# Patient Record
Sex: Male | Born: 1935 | Race: Black or African American | Hispanic: No | Smoking: Former smoker
Health system: Southern US, Community
[De-identification: ages and names within clinical notes are randomized; demographics above are authoritative.]

## PROBLEM LIST (undated history)

## (undated) DIAGNOSIS — M1990 Unspecified osteoarthritis, unspecified site: Secondary | ICD-10-CM

## (undated) DIAGNOSIS — D638 Anemia in other chronic diseases classified elsewhere: Secondary | ICD-10-CM

## (undated) DIAGNOSIS — N189 Chronic kidney disease, unspecified: Secondary | ICD-10-CM

## (undated) DIAGNOSIS — J302 Other seasonal allergic rhinitis: Secondary | ICD-10-CM

## (undated) DIAGNOSIS — E785 Hyperlipidemia, unspecified: Secondary | ICD-10-CM

## (undated) DIAGNOSIS — D126 Benign neoplasm of colon, unspecified: Secondary | ICD-10-CM

## (undated) DIAGNOSIS — I10 Essential (primary) hypertension: Secondary | ICD-10-CM

## (undated) DIAGNOSIS — C61 Malignant neoplasm of prostate: Secondary | ICD-10-CM

## (undated) DIAGNOSIS — K5790 Diverticulosis of intestine, part unspecified, without perforation or abscess without bleeding: Secondary | ICD-10-CM

## (undated) DIAGNOSIS — C801 Malignant (primary) neoplasm, unspecified: Secondary | ICD-10-CM

## (undated) HISTORY — DX: Chronic kidney disease, unspecified: N18.9

## (undated) HISTORY — DX: Benign neoplasm of colon, unspecified: D12.6

## (undated) HISTORY — PX: INSERTION PROSTATE RADIATION SEED: SUR718

## (undated) HISTORY — DX: Unspecified osteoarthritis, unspecified site: M19.90

## (undated) HISTORY — DX: Malignant neoplasm of prostate: C61

## (undated) HISTORY — DX: Anemia in other chronic diseases classified elsewhere: D63.8

## (undated) HISTORY — PX: APPENDECTOMY: SHX54

## (undated) HISTORY — PX: EYE SURGERY: SHX253

## (undated) HISTORY — DX: Diverticulosis of intestine, part unspecified, without perforation or abscess without bleeding: K57.90

## (undated) HISTORY — DX: Hyperlipidemia, unspecified: E78.5

## (undated) HISTORY — PX: HERNIA REPAIR: SHX51

---

## 1998-09-25 ENCOUNTER — Ambulatory Visit (HOSPITAL_COMMUNITY): Admission: RE | Admit: 1998-09-25 | Discharge: 1998-09-25 | Payer: Self-pay | Admitting: Ophthalmology

## 1998-09-25 ENCOUNTER — Encounter: Payer: Self-pay | Admitting: Ophthalmology

## 1999-10-19 ENCOUNTER — Other Ambulatory Visit: Admission: RE | Admit: 1999-10-19 | Discharge: 1999-10-19 | Payer: Self-pay | Admitting: Urology

## 1999-11-12 DIAGNOSIS — C801 Malignant (primary) neoplasm, unspecified: Secondary | ICD-10-CM

## 1999-11-12 HISTORY — DX: Malignant (primary) neoplasm, unspecified: C80.1

## 1999-12-03 ENCOUNTER — Encounter: Admission: RE | Admit: 1999-12-03 | Discharge: 1999-12-03 | Payer: Self-pay | Admitting: Family Medicine

## 1999-12-03 ENCOUNTER — Encounter: Payer: Self-pay | Admitting: Family Medicine

## 1999-12-07 ENCOUNTER — Encounter: Payer: Self-pay | Admitting: Family Medicine

## 1999-12-07 ENCOUNTER — Encounter: Admission: RE | Admit: 1999-12-07 | Discharge: 1999-12-07 | Payer: Self-pay | Admitting: Family Medicine

## 1999-12-12 ENCOUNTER — Encounter: Admission: RE | Admit: 1999-12-12 | Discharge: 1999-12-12 | Payer: Self-pay | Admitting: Family Medicine

## 1999-12-12 ENCOUNTER — Encounter: Payer: Self-pay | Admitting: Family Medicine

## 2000-01-23 ENCOUNTER — Encounter: Admission: RE | Admit: 2000-01-23 | Discharge: 2000-04-22 | Payer: Self-pay | Admitting: Radiation Oncology

## 2000-04-01 ENCOUNTER — Encounter: Payer: Self-pay | Admitting: Urology

## 2000-04-01 ENCOUNTER — Ambulatory Visit (HOSPITAL_BASED_OUTPATIENT_CLINIC_OR_DEPARTMENT_OTHER): Admission: RE | Admit: 2000-04-01 | Discharge: 2000-04-02 | Payer: Self-pay | Admitting: Urology

## 2000-05-15 ENCOUNTER — Encounter: Admission: RE | Admit: 2000-05-15 | Discharge: 2000-08-13 | Payer: Self-pay | Admitting: Radiation Oncology

## 2000-11-25 ENCOUNTER — Encounter: Admission: RE | Admit: 2000-11-25 | Discharge: 2001-02-23 | Payer: Self-pay | Admitting: Radiation Oncology

## 2003-03-21 ENCOUNTER — Encounter (INDEPENDENT_AMBULATORY_CARE_PROVIDER_SITE_OTHER): Payer: Self-pay | Admitting: Specialist

## 2003-03-21 ENCOUNTER — Ambulatory Visit (HOSPITAL_COMMUNITY): Admission: RE | Admit: 2003-03-21 | Discharge: 2003-03-21 | Payer: Self-pay | Admitting: Gastroenterology

## 2004-07-02 ENCOUNTER — Encounter: Admission: RE | Admit: 2004-07-02 | Discharge: 2004-07-02 | Payer: Self-pay | Admitting: Family Medicine

## 2007-09-14 ENCOUNTER — Emergency Department (HOSPITAL_COMMUNITY): Admission: EM | Admit: 2007-09-14 | Discharge: 2007-09-14 | Payer: Self-pay | Admitting: *Deleted

## 2011-03-29 NOTE — Op Note (Signed)
NAME:  Alan Ewing, Alan Ewing                        ACCOUNT NO.:  [id number]   MEDICAL RECORD NO.:  [id number]                   PATIENT TYPE:  AMB   LOCATION:  ENDO                                 FACILITY:  [facility]   PHYSICIAN:  Graylin Shiver, M.D.                DATE OF BIRTH:  [birth year]   DATE OF PROCEDURE:  03/21/2003  DATE OF DISCHARGE:                                 OPERATIVE REPORT   PROCEDURE:  Colonoscopy with polypectomy.   INDICATIONS FOR PROCEDURE:  Rectal bleeding.   PREMEDICATION:  Fentanyl total dose 100 mcg IV, Versed total dose 8 mg IV.  This procedure was done directly after an EGD.   INFORMED CONSENT:  Informed consent was obtained.   DESCRIPTION OF PROCEDURE:  With the patient in the left lateral decubitus  position, a rectal exam was performed and no masses were felt. The Olympus  colonoscope was then inserted into the rectum and advanced around the colon  to the cecum. Cecal landmarks were identified. The cecum looked normal. In  the proximal ascending colon, there was a 5 mm sessile polyp which was  snared and removed by snare cautery technique. The polyp was retrieved, the  cautery site looked good. The rest of the ascending colon looked normal.  The transverse colon looked normal.  The descending colon and sigmoid  revealed moderate diverticulosis. In the distal sigmoid colon, there was a 5  mm sessile polyp which was snared and removed by snare cautery technique,  the polyp was retrieved, cautery site looked good. 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  This was characterized by reddish mucosa and dilated capillaries. A biopsy  was obtained from this area for histological confirmation. He tolerated the  procedure well without complications.   IMPRESSION:  1. 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 I believe this is the source of his rectal  bleeding.  2. Diverticulosis of the left colon.  3. Colon polyps as described above.    PLAN:  The pathology will be checked. I would recommend that the patient be  on a high fiber diet long-term and a therapeutic trial of a bulk agent such  as Metamucil or Citrucel. Should bleeding be an ongoing problem, a  therapeutic trial of a cortisone suppository or enema may be of benefit for  the radiation induced proctitis. A Panasal suppository may also be of  benefit.                                               Graylin Shiver, M.D.    [name]  D:  03/21/2003  T:  03/22/2003  Job:  [id number]   cc:  Lindaann Slough, M.D.  [address]  [place]  [place] [address]  Fax: [phone]

## 2011-03-29 NOTE — Op Note (Signed)
   NAME:  Alan Ewing, Alan Ewing                        ACCOUNT NO.:  [id number]   MEDICAL RECORD NO.:  [id number]                   PATIENT TYPE:  AMB   LOCATION:  ENDO                                 FACILITY:  [facility]   PHYSICIAN:  Graylin Shiver, M.D.                DATE OF BIRTH:  [birth year]   DATE OF PROCEDURE:  03/21/2003  DATE OF DISCHARGE:                                 OPERATIVE REPORT   PROCEDURE:  Esophagogastroduodenoscopy with biopsy.   INDICATIONS FOR PROCEDURE:  Chronic heartburn.   INFORMED CONSENT:  Informed consent was obtained.   PREMEDICATION:  Fentanyl 50 mcg IV total dose, Versed 5 mg IV total dose.   DESCRIPTION OF PROCEDURE:  With the patient in the left lateral decubitus  position, the Olympus gastroscope was inserted into the oropharynx and  passed into the esophagus. It was advanced down the esophagus and then into  the stomach and into the duodenum. The second portion involved with the  duodenum were normal. The stomach showed a diffuse erythematous appearance  to the mucosa compatible with gastritis. Biopsy for CLOtest was obtained  from the distal stomach. No ulcers or erosions were seen. The scope was  retroflexed and no lesions were seen in the fundus or cardia. The scope was  straightened and brought back. There was a hiatal hernia. The  esophagogastric junction was at 37 to 38 cm from the gums. There was a  superior projection of columnar appearing epithelium in the distal esophagus  extending up 1 cm, biopsies were obtained from this area to rule out  Barrett's epithelium. The rest of the esophagus looked normal. He tolerated  the procedure well without complications.   IMPRESSION:  1. Gastritis.  2. Hiatal hernia.  3. Possible Barrett's esophagus.   PLAN:  CLOtest and biopsies will be checked. The patient will be given a  prescription for Aciphex 20 mg IV for his heartburn, further decisions on  followup will be based on biopsy.                                    Graylin Shiver, M.D.    [name]  D:  03/21/2003  T:  03/22/2003  Job:  [id number]   cc:   Lindaann Slough, M.D.  [address]  [place]  [place] [address]  Fax: [phone]

## 2011-03-29 NOTE — Op Note (Signed)
. [facility]  Patient:    Alan Ewing, Alan Ewing                     MRN: [id number] Proc. Date: 04/01/00 Adm. Date:  [id number] Disc. Date: [id number] Attending:  Lindaann Slough CC:         Antony Blackbird, M.D.                           Operative Report  PREOPERATIVE DIAGNOSIS:  Adenocarcinoma of prostate.  POSTOPERATIVE DIAGNOSIS:  Adenocarcinoma of prostate.  PROCEDURE:  Radioactive seeds implantation (I-125).  SURGEONS:  Lindaann Slough, M.D., Billie Lade, M.D.  ANESTHESIA:  General  INDICATIONS:  The patient is a 77 year old male who had an elevated PSA at 6.2.  His Gaona PSA was 6%.  A prostate biopsy was positive for adenocarcinoma, Gleason 6. Treatment options were discussed with the patient; expectant management, radiation therapy versus radical prostatectomy and he chose to have seed implantation. He was scheduled for this procedure.  DESCRIPTION OF PROCEDURE:  The preoperative prostate gland determination had already been done.  Dosagen placement calculations were determined by that study.  Under general anesthesia, the patient was prepped and draped in the dorsal lithotomy position.  Transrectal scanning was done.  When image was identical with the preoperative volume study was obtained, the transducer was fixed.  The template was then anchored.  Spacers were then placed two different canals and advanced o the applicable image plannings.  The procedure was started with the most cephalic plan and ending with the most caudal plan.  When the needles were correctly positioned, which was ascertained by strong echo reflections, stylet was fixed nd the needles withdrawn over the stylets so as to leave the seeds and spacers in he proper position.  Videoscopy was done and showed satisfactory placement of the seeds. A total of 109 seeds were implanted.  The Foley catheter that was previously placed in the bladder was then  removed.  flexible cystoscope was then passed in the bladder. There was a string of seeds in the bladder and under the prostatic mucosa.  Because of the submucosal location of the seeds, it was decided to remove the seeds.  The seeds were then grasped with a grasping forceps and 5 seeds within the Vicryl sheet were removed.  The cystoscope was then removed. A #16 Foley catheter was then passed in the bladder.  The patient tolerated the procedure well and left the operating room in satisfactory condition to the PACU. DD:  04/01/00 TD:  04/06/00 Job: [id number]

## 2011-07-05 ENCOUNTER — Encounter (INDEPENDENT_AMBULATORY_CARE_PROVIDER_SITE_OTHER): Payer: Medicare Other | Admitting: Ophthalmology

## 2011-07-05 DIAGNOSIS — H43819 Vitreous degeneration, unspecified eye: Secondary | ICD-10-CM

## 2011-07-05 DIAGNOSIS — H35379 Puckering of macula, unspecified eye: Secondary | ICD-10-CM

## 2011-07-05 DIAGNOSIS — H35039 Hypertensive retinopathy, unspecified eye: Secondary | ICD-10-CM

## 2011-10-30 ENCOUNTER — Ambulatory Visit (INDEPENDENT_AMBULATORY_CARE_PROVIDER_SITE_OTHER): Payer: Medicare Other | Admitting: Ophthalmology

## 2011-11-22 ENCOUNTER — Ambulatory Visit (INDEPENDENT_AMBULATORY_CARE_PROVIDER_SITE_OTHER): Payer: Medicare Other | Admitting: Ophthalmology

## 2011-11-22 DIAGNOSIS — H35039 Hypertensive retinopathy, unspecified eye: Secondary | ICD-10-CM

## 2011-11-22 DIAGNOSIS — I10 Essential (primary) hypertension: Secondary | ICD-10-CM

## 2011-11-22 DIAGNOSIS — H43819 Vitreous degeneration, unspecified eye: Secondary | ICD-10-CM

## 2011-11-22 DIAGNOSIS — H35379 Puckering of macula, unspecified eye: Secondary | ICD-10-CM

## 2011-11-22 DIAGNOSIS — H26499 Other secondary cataract, unspecified eye: Secondary | ICD-10-CM

## 2011-11-26 ENCOUNTER — Ambulatory Visit (INDEPENDENT_AMBULATORY_CARE_PROVIDER_SITE_OTHER): Payer: Medicare Other | Admitting: Ophthalmology

## 2011-11-29 ENCOUNTER — Ambulatory Visit (INDEPENDENT_AMBULATORY_CARE_PROVIDER_SITE_OTHER): Payer: Medicare Other | Admitting: Ophthalmology

## 2011-11-29 DIAGNOSIS — H2700 Aphakia, unspecified eye: Secondary | ICD-10-CM

## 2011-11-29 DIAGNOSIS — H35379 Puckering of macula, unspecified eye: Secondary | ICD-10-CM | POA: Diagnosis not present

## 2011-11-29 NOTE — H&P (Signed)
Alan Ewing is an 78 y.o. male.   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  No family history on file. Social History:  Stopped smoking over 10 years ago.  Allergies: Allergies not on file  No current facility-administered medications on file as of .   No current outpatient prescriptions on file as of .    Review of systems otherwise negative  There were no vitals taken for this visit.  Physical exam: Mental status: oriented x3. Eyes: See eye exam associated with this date of surgery.  Scanned in by scanning center. 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

## 2011-12-11 DIAGNOSIS — E782 Mixed hyperlipidemia: Secondary | ICD-10-CM | POA: Diagnosis not present

## 2011-12-11 DIAGNOSIS — M1990 Unspecified osteoarthritis, unspecified site: Secondary | ICD-10-CM | POA: Diagnosis not present

## 2011-12-11 DIAGNOSIS — I10 Essential (primary) hypertension: Secondary | ICD-10-CM | POA: Diagnosis not present

## 2011-12-11 DIAGNOSIS — D649 Anemia, unspecified: Secondary | ICD-10-CM | POA: Diagnosis not present

## 2011-12-16 ENCOUNTER — Encounter (HOSPITAL_COMMUNITY): Payer: Self-pay | Admitting: Pharmacy Technician

## 2011-12-19 ENCOUNTER — Encounter (HOSPITAL_COMMUNITY): Payer: Self-pay | Admitting: *Deleted
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
## 2011-12-23 MED ORDER — PHENYLEPHRINE HCL 10 % OP SOLN
1.0000 [drp] | OPHTHALMIC | Status: DC | PRN
  Administered 2011-12-24: 1 [drp] via OPHTHALMIC
  Filled 2011-12-23: qty 5

## 2011-12-23 MED ORDER — CEFAZOLIN SODIUM-DEXTROSE 2-3 GM-% IV SOLR
2.0000 g | INTRAVENOUS | Status: DC | PRN
  Administered 2011-12-24: 2 g via INTRAVENOUS
  Filled 2011-12-23: qty 50

## 2011-12-23 MED ORDER — GATIFLOXACIN 0.5 % OP SOLN
1.0000 [drp] | OPHTHALMIC | Status: AC | PRN
  Administered 2011-12-24: 1 [drp] via OPHTHALMIC
  Filled 2011-12-23: qty 2.5

## 2011-12-23 MED ORDER — CYCLOPENTOLATE HCL 1 % OP SOLN
1.0000 [drp] | OPHTHALMIC | Status: AC | PRN
  Administered 2011-12-24: 1 [drp] via OPHTHALMIC
  Filled 2011-12-23: qty 2

## 2011-12-23 MED ORDER — TROPICAMIDE 1 % OP SOLN
1.0000 [drp] | OPHTHALMIC | Status: AC | PRN
  Administered 2011-12-24: 1 [drp] via OPHTHALMIC
  Filled 2011-12-23: qty 3

## 2011-12-24 ENCOUNTER — Ambulatory Visit (HOSPITAL_COMMUNITY): Payer: Medicare Other

## 2011-12-24 ENCOUNTER — Other Ambulatory Visit: Payer: Self-pay

## 2011-12-24 ENCOUNTER — Encounter (HOSPITAL_COMMUNITY): Payer: Self-pay | Admitting: Anesthesiology

## 2011-12-24 ENCOUNTER — Ambulatory Visit (HOSPITAL_COMMUNITY): Payer: Medicare Other | Admitting: Anesthesiology

## 2011-12-24 ENCOUNTER — Encounter (HOSPITAL_COMMUNITY)
Admission: RE | Disposition: A | Discharge status: Home / Self Care | Payer: Self-pay | Source: Ambulatory Visit | Attending: Ophthalmology

## 2011-12-24 ENCOUNTER — Ambulatory Visit (HOSPITAL_COMMUNITY)
Admission: RE | Admit: 2011-12-24 | Discharge: 2011-12-25 | Disposition: A | Discharge status: Home / Self Care | Payer: Medicare Other | Source: Ambulatory Visit | Attending: Ophthalmology | Admitting: Ophthalmology

## 2011-12-24 DIAGNOSIS — H35379 Puckering of macula, unspecified eye: Secondary | ICD-10-CM | POA: Insufficient documentation

## 2011-12-24 DIAGNOSIS — Z01811 Encounter for preprocedural respiratory examination: Secondary | ICD-10-CM | POA: Diagnosis not present

## 2011-12-24 DIAGNOSIS — I10 Essential (primary) hypertension: Secondary | ICD-10-CM | POA: Insufficient documentation

## 2011-12-24 DIAGNOSIS — H35349 Macular cyst, hole, or pseudohole, unspecified eye: Secondary | ICD-10-CM | POA: Diagnosis not present

## 2011-12-24 DIAGNOSIS — H35372 Puckering of macula, left eye: Secondary | ICD-10-CM

## 2011-12-24 HISTORY — DX: Unspecified osteoarthritis, unspecified site: M19.90

## 2011-12-24 HISTORY — PX: GAS/FLUID EXCHANGE: SHX5334

## 2011-12-24 HISTORY — PX: PARS PLANA VITRECTOMY: SHX2166

## 2011-12-24 HISTORY — PX: GAS INSERTION: SHX5336

## 2011-12-24 HISTORY — DX: Essential (primary) hypertension: I10

## 2011-12-24 HISTORY — DX: Other seasonal allergic rhinitis: J30.2

## 2011-12-24 HISTORY — DX: Malignant (primary) neoplasm, unspecified: C80.1

## 2011-12-24 LAB — CBC
HCT: 37 % — ABNORMAL LOW (ref 39.0–52.0)
MCH: 28.8 pg (ref 26.0–34.0)
MCHC: 33.2 g/dL (ref 30.0–36.0)
RBC: 4.27 MIL/uL (ref 4.22–5.81)
RDW: 12.6 % (ref 11.5–15.5)

## 2011-12-24 LAB — BASIC METABOLIC PANEL
BUN: 21 mg/dL (ref 6–23)
Chloride: 102 mEq/L (ref 96–112)
Glucose, Bld: 116 mg/dL — ABNORMAL HIGH (ref 70–99)

## 2011-12-24 LAB — SURGICAL PCR SCREEN: Staphylococcus aureus: POSITIVE — AB

## 2011-12-24 SURGERY — PARS PLANA VITRECTOMY WITH 25 GAUGE
Anesthesia: General | Site: Eye | Laterality: Left | Wound class: Clean

## 2011-12-24 MED ORDER — BSS IO SOLN
INTRAOCULAR | Status: DC | PRN
  Administered 2011-12-24: 15 mL via INTRAOCULAR

## 2011-12-24 MED ORDER — ONDANSETRON HCL 4 MG/2ML IJ SOLN
INTRAMUSCULAR | Status: DC | PRN
  Administered 2011-12-24: 4 mg via INTRAVENOUS

## 2011-12-24 MED ORDER — GATIFLOXACIN 0.5 % OP SOLN
1.0000 [drp] | Freq: Four times a day (QID) | OPHTHALMIC | Status: DC
  Administered 2011-12-25: 1 [drp] via OPHTHALMIC
  Filled 2011-12-24: qty 2.5

## 2011-12-24 MED ORDER — PROMETHAZINE HCL 25 MG/ML IJ SOLN: 6.2500 mg | INTRAMUSCULAR | Status: DC | PRN

## 2011-12-24 MED ORDER — GLYCOPYRROLATE 0.2 MG/ML IJ SOLN
INTRAMUSCULAR | Status: DC | PRN
  Administered 2011-12-24: .4 mg via INTRAVENOUS

## 2011-12-24 MED ORDER — TRIAMCINOLONE ACETONIDE 40 MG/ML IJ SUSP: INTRAMUSCULAR | Status: DC | PRN

## 2011-12-24 MED ORDER — ONDANSETRON HCL 4 MG/2ML IJ SOLN: 4.0000 mg | Freq: Four times a day (QID) | INTRAMUSCULAR | Status: DC | PRN

## 2011-12-24 MED ORDER — OXYCODONE-ACETAMINOPHEN 5-325 MG PO TABS
1.0000 | ORAL_TABLET | ORAL | Status: DC | PRN
  Administered 2011-12-24 – 2011-12-25 (×3): 2 via ORAL
  Filled 2011-12-24: qty 1
  Filled 2011-12-24 (×2): qty 2
  Filled 2011-12-24: qty 1
  Filled 2011-12-24: qty 2

## 2011-12-24 MED ORDER — MUPIROCIN 2 % EX OINT
TOPICAL_OINTMENT | CUTANEOUS | Status: AC
  Administered 2011-12-24: 1 via NASAL
  Filled 2011-12-24: qty 22

## 2011-12-24 MED ORDER — MEPERIDINE HCL 25 MG/ML IJ SOLN: 6.2500 mg | INTRAMUSCULAR | Status: DC | PRN

## 2011-12-24 MED ORDER — PROVISC 10 MG/ML IO SOLN
INTRAOCULAR | Status: DC | PRN
  Administered 2011-12-24: .85 mL via INTRAOCULAR

## 2011-12-24 MED ORDER — PROPOFOL 10 MG/ML IV EMUL
INTRAVENOUS | Status: DC | PRN
  Administered 2011-12-24: 200 mg via INTRAVENOUS

## 2011-12-24 MED ORDER — BSS PLUS IO SOLN
INTRAOCULAR | Status: DC | PRN
  Administered 2011-12-24: 1 via OPHTHALMIC

## 2011-12-24 MED ORDER — LATANOPROST 0.005 % OP SOLN
1.0000 [drp] | Freq: Every day | OPHTHALMIC | Status: DC
  Filled 2011-12-24: qty 2.5

## 2011-12-24 MED ORDER — TEMAZEPAM 15 MG PO CAPS
15.0000 mg | ORAL_CAPSULE | Freq: Every evening | ORAL | Status: DC | PRN
  Administered 2011-12-24: 15 mg via ORAL
  Filled 2011-12-24: qty 1

## 2011-12-24 MED ORDER — ACETAZOLAMIDE SODIUM 500 MG IJ SOLR
500.0000 mg | Freq: Once | INTRAMUSCULAR | Status: AC
  Administered 2011-12-25: 500 mg via INTRAVENOUS

## 2011-12-24 MED ORDER — DEXAMETHASONE SODIUM PHOSPHATE 10 MG/ML IJ SOLN
INTRAMUSCULAR | Status: DC | PRN
  Administered 2011-12-24: 10 mg

## 2011-12-24 MED ORDER — BUPIVACAINE HCL 0.75 % IJ SOLN
INTRAMUSCULAR | Status: DC | PRN
  Administered 2011-12-24: 10 mL

## 2011-12-24 MED ORDER — SODIUM CHLORIDE 0.9 % IV SOLN: INTRAVENOUS | Status: DC

## 2011-12-24 MED ORDER — ACETAMINOPHEN 325 MG PO TABS: 325.0000 mg | ORAL_TABLET | ORAL | Status: DC | PRN

## 2011-12-24 MED ORDER — BRIMONIDINE TARTRATE 0.2 % OP SOLN
1.0000 [drp] | Freq: Two times a day (BID) | OPHTHALMIC | Status: DC
  Filled 2011-12-24: qty 5

## 2011-12-24 MED ORDER — ROCURONIUM BROMIDE 100 MG/10ML IV SOLN
INTRAVENOUS | Status: DC | PRN
  Administered 2011-12-24: 25 mg via INTRAVENOUS

## 2011-12-24 MED ORDER — MORPHINE SULFATE 2 MG/ML IJ SOLN
INTRAMUSCULAR | Status: AC
  Administered 2011-12-24: 2 mg via INTRAVENOUS
  Filled 2011-12-24: qty 1

## 2011-12-24 MED ORDER — SODIUM CHLORIDE 0.9 % IV SOLN
INTRAVENOUS | Status: DC | PRN
  Administered 2011-12-24 (×2): via INTRAVENOUS

## 2011-12-24 MED ORDER — BSS IO SOLN
INTRAOCULAR | Status: DC | PRN
  Administered 2011-12-24: 500 mL via INTRAOCULAR

## 2011-12-24 MED ORDER — MORPHINE SULFATE 4 MG/ML IJ SOLN: 0.0500 mg/kg | INTRAMUSCULAR | Status: DC | PRN

## 2011-12-24 MED ORDER — TETRACAINE HCL 0.5 % OP SOLN: 2.0000 [drp] | Freq: Once | OPHTHALMIC | Status: DC

## 2011-12-24 MED ORDER — MORPHINE SULFATE 2 MG/ML IJ SOLN
1.0000 mg | INTRAMUSCULAR | Status: DC | PRN
  Administered 2011-12-24: 2 mg via INTRAVENOUS

## 2011-12-24 MED ORDER — CHLORHEXIDINE GLUCONATE CLOTH 2 % EX PADS
6.0000 | MEDICATED_PAD | Freq: Every day | CUTANEOUS | Status: DC
  Administered 2011-12-24: 6 via TOPICAL

## 2011-12-24 MED ORDER — SODIUM CHLORIDE 0.45 % IV SOLN
INTRAVENOUS | Status: DC
  Administered 2011-12-24: 30 mL/h via INTRAVENOUS

## 2011-12-24 MED ORDER — HYDROMORPHONE HCL PF 1 MG/ML IJ SOLN: 0.2500 mg | INTRAMUSCULAR | Status: DC | PRN

## 2011-12-24 MED ORDER — HEMOSTATIC AGENTS (NO CHARGE) OPTIME
TOPICAL | Status: DC | PRN
  Administered 2011-12-24: 1 via TOPICAL

## 2011-12-24 MED ORDER — EPINEPHRINE HCL 1 MG/ML IJ SOLN
INTRAMUSCULAR | Status: DC | PRN
  Administered 2011-12-24: .3 mL

## 2011-12-24 MED ORDER — MUPIROCIN 2 % EX OINT
1.0000 "application " | TOPICAL_OINTMENT | Freq: Two times a day (BID) | CUTANEOUS | Status: DC
  Administered 2011-12-24 – 2011-12-25 (×2): 1 via NASAL
  Filled 2011-12-24: qty 22

## 2011-12-24 MED ORDER — FENTANYL CITRATE 0.05 MG/ML IJ SOLN
INTRAMUSCULAR | Status: DC | PRN
  Administered 2011-12-24: 125 ug via INTRAVENOUS

## 2011-12-24 MED ORDER — MAGNESIUM HYDROXIDE 400 MG/5ML PO SUSP: 15.0000 mL | Freq: Four times a day (QID) | ORAL | Status: DC | PRN

## 2011-12-24 MED ORDER — BACITRACIN-POLYMYXIN B 500-10000 UNIT/GM OP OINT
TOPICAL_OINTMENT | OPHTHALMIC | Status: DC | PRN
  Administered 2011-12-24: 1 via OPHTHALMIC

## 2011-12-24 MED ORDER — PREDNISOLONE ACETATE 1 % OP SUSP
1.0000 [drp] | Freq: Four times a day (QID) | OPHTHALMIC | Status: DC
  Administered 2011-12-25: 1 [drp] via OPHTHALMIC
  Filled 2011-12-24: qty 1

## 2011-12-24 MED ORDER — SODIUM CHLORIDE 0.9 % IJ SOLN
INTRAMUSCULAR | Status: DC | PRN
  Administered 2011-12-24: 13:00:00

## 2011-12-24 MED ORDER — NEOSTIGMINE METHYLSULFATE 1 MG/ML IJ SOLN
INTRAMUSCULAR | Status: DC | PRN
  Administered 2011-12-24: 3 mg via INTRAVENOUS

## 2011-12-24 SURGICAL SUPPLY — 61 items
APL SRG 3 HI ABS STRL LF PLS (MISCELLANEOUS)
APPLICATOR DR MATTHEWS STRL (MISCELLANEOUS) IMPLANT
BALL CTTN LRG ABS STRL LF (GAUZE/BANDAGES/DRESSINGS) ×3
BLADE EYE CATARACT 19 1.4 BEAV (BLADE) IMPLANT
BLADE MVR KNIFE 19G (BLADE) IMPLANT
BLADE MVR KNIFE 20G (BLADE) IMPLANT
CANNULA DUAL BORE 23G (CANNULA) IMPLANT
CANNULA FLEX TIP 25G (CANNULA) ×1 IMPLANT
CLOTH BEACON ORANGE TIMEOUT ST (SAFETY) ×2 IMPLANT
CORDS BIPOLAR (ELECTRODE) IMPLANT
COTTONBALL LRG STERILE PKG (GAUZE/BANDAGES/DRESSINGS) ×6 IMPLANT
DRAPE OPHTHALMIC 77X100 STRL (CUSTOM PROCEDURE TRAY) ×2 IMPLANT
FILTER BLUE MILLIPORE (MISCELLANEOUS) ×1 IMPLANT
FILTER STRAW FLUID ASPIR (MISCELLANEOUS) IMPLANT
FORCEPS ECKARDT ILM 25G SERR (OPHTHALMIC RELATED) ×1 IMPLANT
GAS OPHTHALMIC (MISCELLANEOUS) ×1 IMPLANT
GLOVE SS BIOGEL STRL SZ 7 (GLOVE) ×1 IMPLANT
GLOVE SUPERSENSE BIOGEL SZ 7 (GLOVE) ×1
GLOVE SURG 8.5 LATEX PF (GLOVE) ×2 IMPLANT
GOWN STRL NON-REIN LRG LVL3 (GOWN DISPOSABLE) ×7 IMPLANT
ILLUMINATOR CHOW PICK 25GA (MISCELLANEOUS) ×2 IMPLANT
KIT ROOM TURNOVER OR (KITS) ×2 IMPLANT
KNIFE CRESCENT 2.5 55 ANG (BLADE) IMPLANT
LENS BIOM SUPER VIEW SET DISP (OPHTHALMIC RELATED) IMPLANT
MARKER SKIN DUAL TIP RULER LAB (MISCELLANEOUS) ×2 IMPLANT
MICROPICK 25G (MISCELLANEOUS)
MICROPICK VITREORETINAL 5114 (MISCELLANEOUS) IMPLANT
NDL 18GX1X1/2 (RX/OR ONLY) (NEEDLE) ×1 IMPLANT
NDL 25GX 5/8IN NON SAFETY (NEEDLE) ×1 IMPLANT
NDL HYPO 30X.5 LL (NEEDLE) ×1 IMPLANT
NEEDLE 18GX1X1/2 (RX/OR ONLY) (NEEDLE) ×2 IMPLANT
NEEDLE 25GX 5/8IN NON SAFETY (NEEDLE) ×2 IMPLANT
NEEDLE 27GAX1X1/2 (NEEDLE) IMPLANT
NEEDLE HYPO 30X.5 LL (NEEDLE) ×4 IMPLANT
NS IRRIG 1000ML POUR BTL (IV SOLUTION) ×2 IMPLANT
PACK VITRECTOMY CUSTOM (CUSTOM PROCEDURE TRAY) ×2 IMPLANT
PAD ARMBOARD 7.5X6 YLW CONV (MISCELLANEOUS) ×4 IMPLANT
PAK VITRECTOMY PIK 25 GA (OPHTHALMIC RELATED) ×2 IMPLANT
PENCIL BIPOLAR 25GA STR DISP (OPHTHALMIC RELATED) IMPLANT
PICK MICROPICK 25G (MISCELLANEOUS) IMPLANT
PROBE DIRECTIONAL LASER (MISCELLANEOUS) IMPLANT
REPL STRA BRUSH NDL (NEEDLE) IMPLANT
REPL STRA BRUSH NEEDLE (NEEDLE) IMPLANT
RESERVOIR BACK FLUSH (MISCELLANEOUS) IMPLANT
ROLLS DENTAL (MISCELLANEOUS) ×4 IMPLANT
SCRAPER DIAMOND DUST MEMBRANE (MISCELLANEOUS) ×1 IMPLANT
SPONGE SURGIFOAM ABS GEL 12-7 (HEMOSTASIS) ×2 IMPLANT
STOPCOCK 4 WAY LG BORE MALE ST (IV SETS) IMPLANT
SUT CHROMIC 7 0 TG140 8 (SUTURE) IMPLANT
SUT ETHILON 10 0 CS140 6 (SUTURE) IMPLANT
SUT ETHILON 9 0 TG140 8 (SUTURE) IMPLANT
SUT POLY NON ABSORB 10-0 8 STR (SUTURE) IMPLANT
SUT SILK 4 0 RB 1 (SUTURE) IMPLANT
SYR 20CC LL (SYRINGE) ×2 IMPLANT
SYR 5ML LL (SYRINGE) IMPLANT
SYR BULB 3OZ (MISCELLANEOUS) ×2 IMPLANT
SYR TB 1ML LUER SLIP (SYRINGE) ×2 IMPLANT
SYRINGE 10CC LL (SYRINGE) IMPLANT
TOWEL OR 17X24 6PK STRL BLUE (TOWEL DISPOSABLE) ×6 IMPLANT
TROCAR CANNULA 25GA (CANNULA) ×1 IMPLANT
WATER STERILE IRR 1000ML POUR (IV SOLUTION) ×2 IMPLANT

## 2011-12-24 NOTE — Preoperative (Signed)
Beta Blockers   Reason not to administer Beta Blockers:Not Applicable 

## 2011-12-24 NOTE — Brief Op Note (Signed)
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 Laser photocoagulation. C3F8 gas injection  Surgeon:  Sherrie George, MD...  Assistant:  Rosalie Doctor SA    Anesthesia: General  Specimen: none  Estimated blood loss:  1cc  Complications: none  Patient sent to PACU in good condition  Composed by Sherrie George MD  Dictation number: [id number]
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
## 2011-12-24 NOTE — Progress Notes (Signed)
Call again to Urgent care at [facility] for records, didn't realize that prev. Practitioner had called already.

## 2011-12-24 NOTE — Transfer of Care (Signed)
Immediate Anesthesia Transfer of Care Note  Patient: Alan Ewing  Procedure(s) Performed: Procedure(s) (LRB): PARS PLANA VITRECTOMY WITH 25 GAUGE (Left) GAS/FLUID EXCHANGE (Left) INSERTION OF GAS (Left) MEMBRANE PEEL (Left) DIODE LASER APPLICATION (Left)  Patient Location: PACU  Anesthesia Type: General  Level of Consciousness: awake and alert   Airway & Oxygen Therapy: Patient Spontanous Breathing and Patient connected to face mask oxygen  Post-op Assessment: Report given to PACU RN  Post vital signs: Reviewed and stable  Complications: No apparent anesthesia complications

## 2011-12-24 NOTE — Anesthesia Postprocedure Evaluation (Signed)
  Anesthesia Post-op Note  Patient: Alan Ewing  Procedure(s) Performed: Procedure(s) (LRB): PARS PLANA VITRECTOMY WITH 25 GAUGE (Left) GAS/FLUID EXCHANGE (Left) INSERTION OF GAS (Left) MEMBRANE PEEL (Left) DIODE LASER APPLICATION (Left)  Patient Location: PACU  Anesthesia Type: General  Level of Consciousness: awake  Airway and Oxygen Therapy: Patient Spontanous Breathing  Post-op Pain: mild  Post-op Assessment: Post-op Vital signs reviewed  Post-op Vital Signs: stable  Complications: No apparent anesthesia complications

## 2011-12-24 NOTE — Anesthesia Preprocedure Evaluation (Addendum)
Anesthesia Evaluation  Patient identified by MRN, date of birth, ID band Patient awake    Reviewed: Allergy & Precautions, H&P , NPO status , Patient's Chart, lab work & pertinent test results  Airway Mallampati: II      Dental   Pulmonary  clear to auscultation        Cardiovascular hypertension, Pt. on medications Regular Normal    Neuro/Psych    GI/Hepatic negative GI ROS,   Endo/Other  Negative Endocrine ROS  Renal/GU negative Renal ROS     Musculoskeletal   Abdominal   Peds  Hematology   Anesthesia Other Findings   Reproductive/Obstetrics                           Anesthesia Physical Anesthesia Plan  ASA: III  Anesthesia Plan: General   Post-op Pain Management:    Induction: Intravenous  Airway Management Planned: Oral ETT  Additional Equipment:   Intra-op Plan:   Post-operative Plan:   Informed Consent: I have reviewed the patients History and Physical, chart, labs and discussed the procedure including the risks, benefits and alternatives for the proposed anesthesia with the patient or authorized representative who has indicated his/her understanding and acceptance.     Plan Discussed with:   Anesthesia Plan Comments:         Anesthesia Quick Evaluation

## 2011-12-24 NOTE — H&P (Signed)
I have examined the patient and there is no change in his medical status 

## 2011-12-25 ENCOUNTER — Encounter (HOSPITAL_COMMUNITY): Payer: Self-pay | Admitting: Ophthalmology

## 2011-12-25 MED ORDER — GATIFLOXACIN 0.5 % OP SOLN: 1.0000 [drp] | Freq: Four times a day (QID) | OPHTHALMIC | Status: DC

## 2011-12-25 MED ORDER — PREDNISOLONE ACETATE 1 % OP SUSP: 1.0000 [drp] | Freq: Four times a day (QID) | OPHTHALMIC | Status: AC

## 2011-12-25 MED ORDER — BACITRACIN-POLYMYXIN B 500-10000 UNIT/GM OP OINT: TOPICAL_OINTMENT | Freq: Three times a day (TID) | OPHTHALMIC | Status: AC

## 2011-12-25 MED ORDER — BACITRACIN-POLYMYXIN B 500-10000 UNIT/GM OP OINT
TOPICAL_OINTMENT | Freq: Three times a day (TID) | OPHTHALMIC | Status: DC
  Administered 2011-12-25: 10:00:00 via OPHTHALMIC
  Filled 2011-12-25: qty 3.5

## 2011-12-25 MED ORDER — BRIMONIDINE TARTRATE 0.2 % OP SOLN: 1.0000 [drp] | Freq: Two times a day (BID) | OPHTHALMIC | Status: AC

## 2011-12-25 NOTE — Discharge Summary (Signed)
DC summ not needed for this OWER patient

## 2011-12-25 NOTE — Progress Notes (Signed)
Discharge instructions reviewed for 2nd time with patient. Questions answered. Eyedrops administration demonstrated earlier today. Printed AVS and eye care supplies given to patient. Pt discharged to home via wheelchair. Accompanied by family friend.

## 2011-12-25 NOTE — Op Note (Signed)
NAME:  Alan Ewing, Alan Ewing                  ACCOUNT NO.:  [id number]  MEDICAL RECORD NO.:  [id number]  LOCATION:  5122                         FACILITY:  [facility]  PHYSICIAN:  Beulah Gandy. Ashley Royalty, M.D. DATE OF BIRTH:  25-Mar-1925  DATE OF PROCEDURE:  12/24/2011 DATE OF DISCHARGE:                              OPERATIVE REPORT   ADMISSION DIAGNOSIS:  Preretinal fibrosis, left eye.  POSTOPERATIVE DIAGNOSES: 1. Preretinal fibrosis, left eye. 2. Macular hole, left eye.  SURGEON:  Beulah Gandy. Ashley Royalty, MD  ASSISTANT:  Rosalie Doctor, MA  PROCEDURES: 1. Pars plana vitrectomy. 2. Membrane peel. 3. Retinal photocoagulation. 4. Gas-fluid exchange in the right eye.  DETAILS:  After proper endotracheal anesthesia, the indirect ophthalmoscope laser was moved into place, 484 burns were placed around the retinal periphery in 2 rows.  Several areas of retina were supported with this laser photocoagulation, the power was 440 mW, 1000 microns each, and 0.1 seconds each.  Attention was then carried to the pars plana area where 25-gauge trocars were placed at 10, 2, and 4 o'clock, and infusion at 4 o'clock.  The contact lens ring was anchored into place at 6 and 12 o'clock.  Provisc was placed on the corneal surface and the flat contact lens was placed.  Pars plana vitrectomy was begun just behind the pseudophakos where dense vitreous material was removed under low suction and rapid cutting.  The vitrectomy was carried out into the far periphery with a 30-degree prismatic lens until all vitreous was trimmed down to the vitreous base.  The flat contact lens was repositioned on the eye.  The attention was carried to the macular region where a shiny, glistening membrane was seen overlying a yellow egg yolk-type foveal region.  The membrane was engaged with a 25-gauge sharp pick.  Membrane had the consistency of the cellophane and was pushed ahead of the pick until engaged and then the membrane was then peeled across  the macular region.  Once the membrane was peeled, it was seen that a small macular hole was present in the yellow foveal pigmented area.  The entire membrane was removed.  The vitrectomy was carried out again to the far periphery and all vitreous was removed.  A total gas-fluid exchange was then carried out.  C3F8 and the 14% concentration was prepared.  Additional fluid was removed from the posterior pole after a brief wait.  Once all the fluid was removed, the C3F8, 14% was exchanged for intravitreal gas.  The instruments and trocars were removed from the eye.  The wounds were tested and found to be tight.  Polymyxin and gentamicin were irrigated into Tenon's space. Atropine solution was applied.  Decadron 10 mg was injected into the lower subconjunctival space.  Marcaine was injected around the globe for postop pain.  Polysporin ophthalmic ointment, patch and shield were placed.  The patient was awakened and taken to recovery in satisfactory condition.  Closing pressure was 10 with a Barraquer tonometer.     Beulah Gandy. Ashley Royalty, M.D.     JDM/MEDQ  D:  12/24/2011  T:  12/25/2011  Job:  [id number]

## 2011-12-25 NOTE — Progress Notes (Signed)
12/25/2011, 6:50 AM  Mental Status:  Awake, Alert, Oriented  Anterior segment: Cornea  Clear    Anterior Chamber Clear    Lens:    IOL  Intra Ocular Pressure 21 mmHg with Tonopen  Vitreous: Clear 90%gas bubble  Retina:  Attached Good laser reaction Hazy view  Impression: Excellent result Retina attached  Final Diagnosis: Active Problems: Pre retinal fibrosis    Plan: start post operative eye drops.  Discharge to home.  Give post operative instructions  Sherrie George 12/25/2011, 6:50 AM

## 2011-12-25 NOTE — Progress Notes (Signed)
Chaplain's Note:  Responded to pt request for Bible.  Introduced self to pt and presented Bible.  Offered pastoral presence.  Pt thanked chaplain for visit and Bible.

## 2011-12-31 DIAGNOSIS — N179 Acute kidney failure, unspecified: Secondary | ICD-10-CM | POA: Diagnosis not present

## 2012-01-01 ENCOUNTER — Inpatient Hospital Stay (INDEPENDENT_AMBULATORY_CARE_PROVIDER_SITE_OTHER): Payer: Medicare Other | Admitting: Ophthalmology

## 2012-01-01 DIAGNOSIS — H35379 Puckering of macula, unspecified eye: Secondary | ICD-10-CM

## 2012-01-01 DIAGNOSIS — H35349 Macular cyst, hole, or pseudohole, unspecified eye: Secondary | ICD-10-CM

## 2012-01-22 ENCOUNTER — Encounter (INDEPENDENT_AMBULATORY_CARE_PROVIDER_SITE_OTHER): Payer: Medicare Other | Admitting: Ophthalmology

## 2012-01-22 DIAGNOSIS — H35379 Puckering of macula, unspecified eye: Secondary | ICD-10-CM

## 2012-01-22 DIAGNOSIS — H35349 Macular cyst, hole, or pseudohole, unspecified eye: Secondary | ICD-10-CM

## 2012-02-16 IMAGING — CR DG CHEST 2V
2 series · 2 of 2 positions shown · non-contrast
Comparison: None.

CLINICAL DATA: 86-year-old male preoperative study.  Hypertension.

CHEST - 2 VIEW

[view not recorded (1 of 2)]
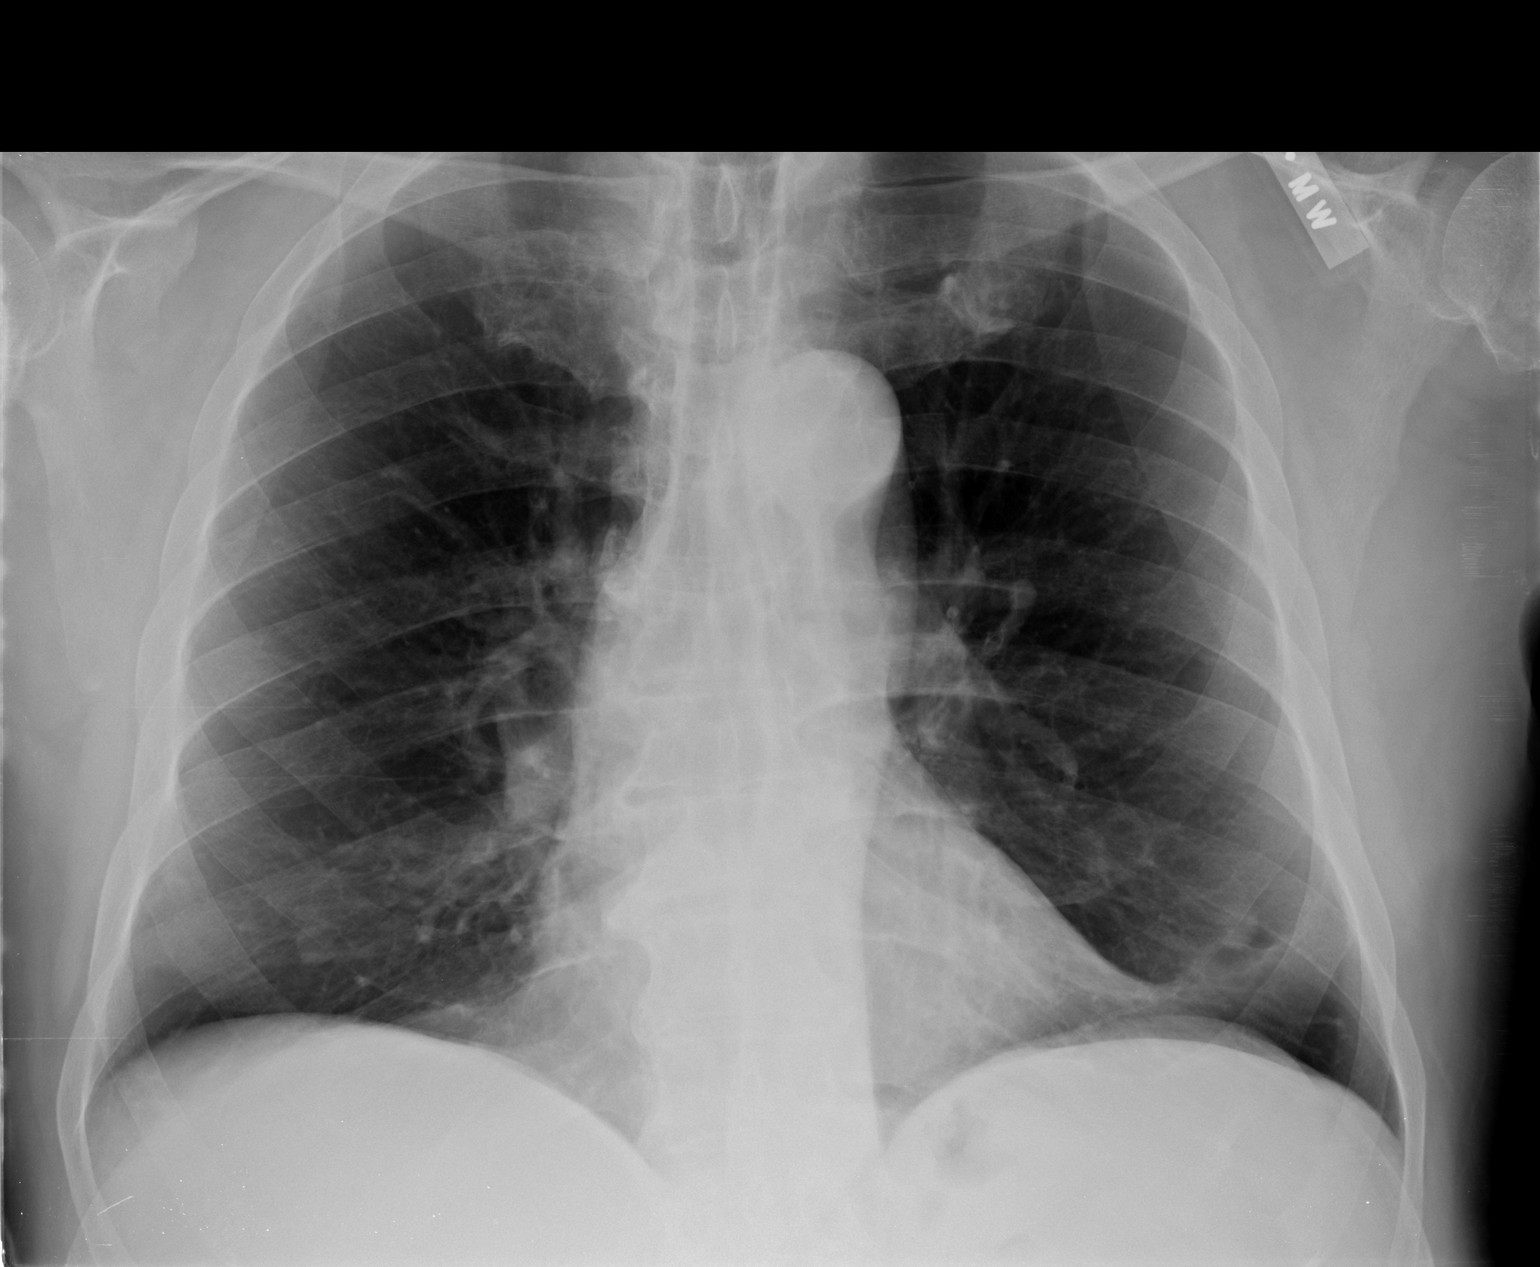

[view not recorded (2 of 2)]
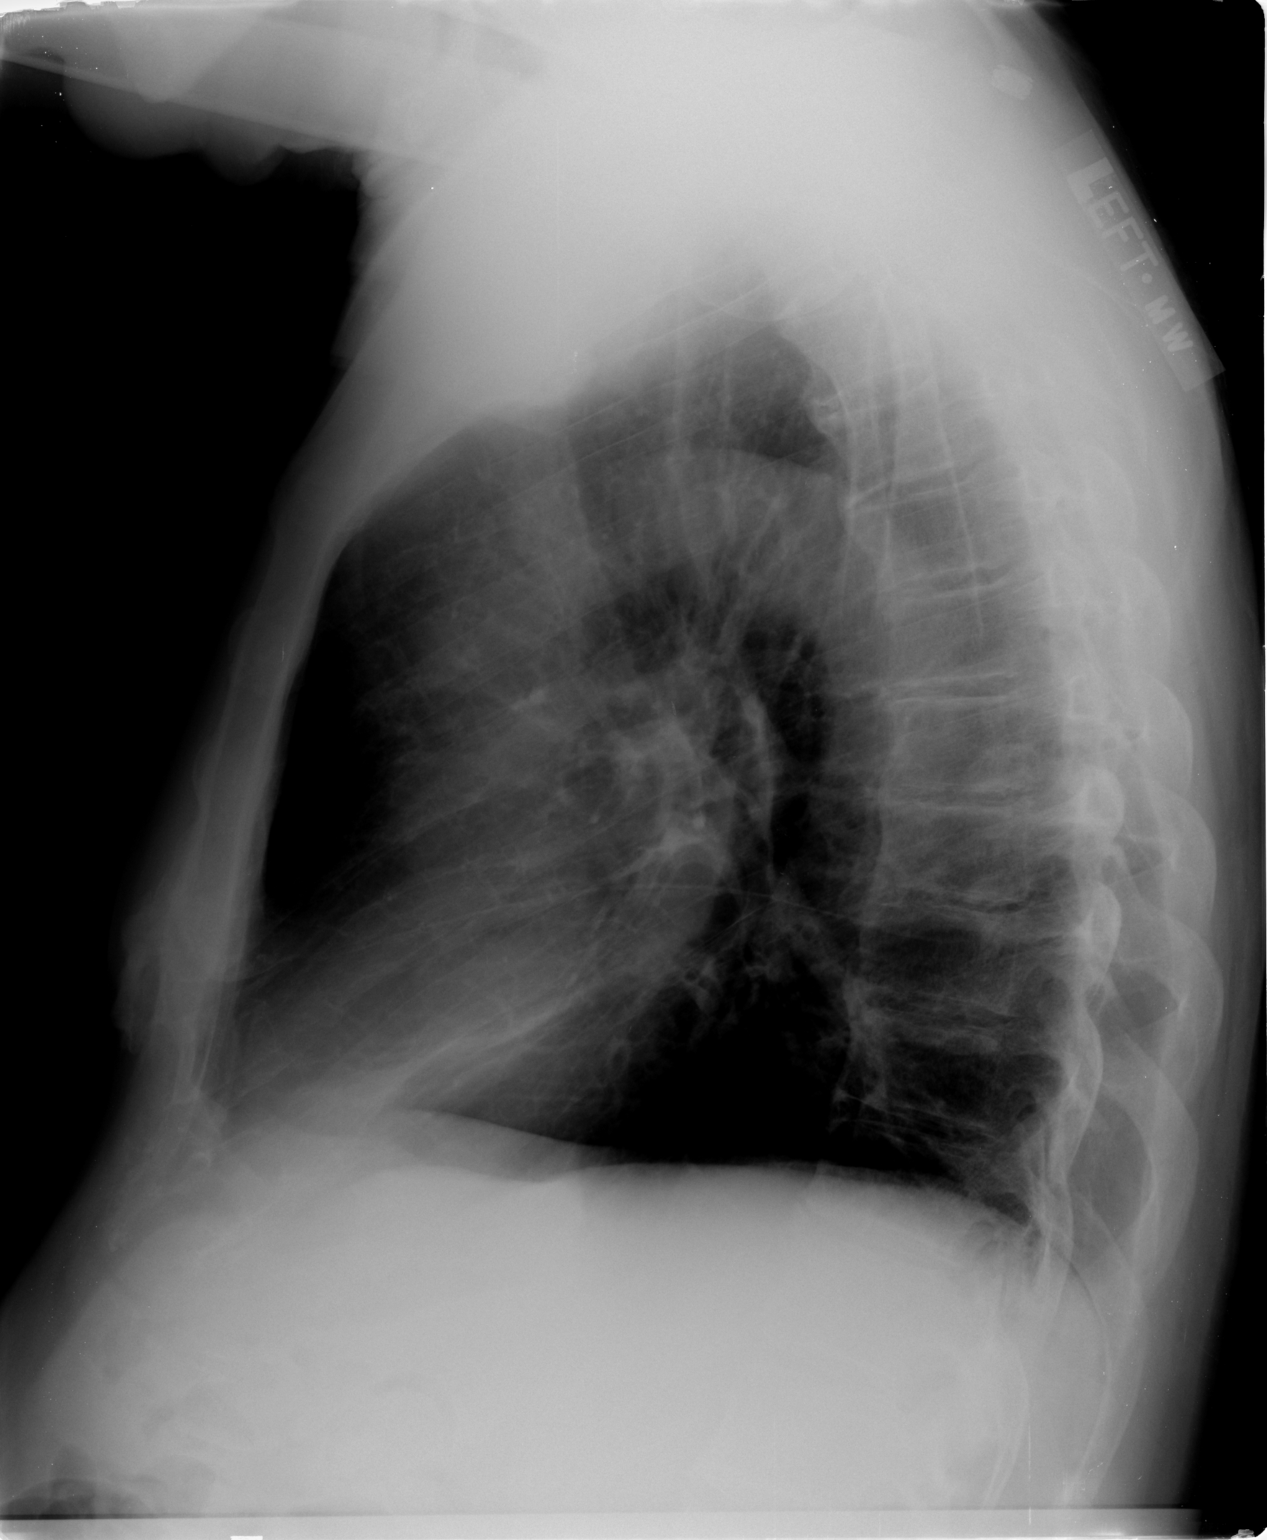

[2 of 2 positions shown; findings below may reference images not displayed]

FINDINGS: There is a mild degree of pulmonary hyperinflation.  Mild
linear scarring or atelectasis in the lingula.  Cardiac size and
mediastinal contours are within normal limits.  Visualized tracheal
air column is within normal limits.  No pneumothorax, pulmonary
edema, pleural effusion or confluent pulmonary opacity.  Flowing
osteophytes in the spine. No acute osseous abnormality identified.
IMPRESSION: No acute cardiopulmonary abnormality.

## 2012-03-23 ENCOUNTER — Encounter (INDEPENDENT_AMBULATORY_CARE_PROVIDER_SITE_OTHER): Payer: Medicare Other | Admitting: Ophthalmology

## 2012-03-23 DIAGNOSIS — H43819 Vitreous degeneration, unspecified eye: Secondary | ICD-10-CM

## 2012-03-23 DIAGNOSIS — H35349 Macular cyst, hole, or pseudohole, unspecified eye: Secondary | ICD-10-CM

## 2012-03-23 DIAGNOSIS — I10 Essential (primary) hypertension: Secondary | ICD-10-CM | POA: Diagnosis not present

## 2012-03-23 DIAGNOSIS — H35039 Hypertensive retinopathy, unspecified eye: Secondary | ICD-10-CM | POA: Diagnosis not present

## 2012-04-29 DIAGNOSIS — C61 Malignant neoplasm of prostate: Secondary | ICD-10-CM | POA: Diagnosis not present

## 2012-05-06 DIAGNOSIS — C61 Malignant neoplasm of prostate: Secondary | ICD-10-CM | POA: Diagnosis not present

## 2012-07-06 DIAGNOSIS — K649 Unspecified hemorrhoids: Secondary | ICD-10-CM | POA: Diagnosis not present

## 2012-07-06 DIAGNOSIS — Z0000 Encounter for general adult medical examination without abnormal findings: Secondary | ICD-10-CM | POA: Diagnosis not present

## 2012-07-06 DIAGNOSIS — I10 Essential (primary) hypertension: Secondary | ICD-10-CM | POA: Diagnosis not present

## 2012-07-06 DIAGNOSIS — E782 Mixed hyperlipidemia: Secondary | ICD-10-CM | POA: Diagnosis not present

## 2012-07-06 DIAGNOSIS — D649 Anemia, unspecified: Secondary | ICD-10-CM | POA: Diagnosis not present

## 2012-07-06 DIAGNOSIS — M1990 Unspecified osteoarthritis, unspecified site: Secondary | ICD-10-CM | POA: Diagnosis not present

## 2012-07-22 DIAGNOSIS — K649 Unspecified hemorrhoids: Secondary | ICD-10-CM | POA: Diagnosis not present

## 2012-08-05 DIAGNOSIS — Z23 Encounter for immunization: Secondary | ICD-10-CM | POA: Diagnosis not present

## 2012-09-23 ENCOUNTER — Ambulatory Visit (INDEPENDENT_AMBULATORY_CARE_PROVIDER_SITE_OTHER): Payer: Medicare Other | Admitting: Ophthalmology

## 2012-09-23 DIAGNOSIS — H35039 Hypertensive retinopathy, unspecified eye: Secondary | ICD-10-CM

## 2012-09-23 DIAGNOSIS — H35349 Macular cyst, hole, or pseudohole, unspecified eye: Secondary | ICD-10-CM

## 2012-09-23 DIAGNOSIS — H43819 Vitreous degeneration, unspecified eye: Secondary | ICD-10-CM

## 2012-09-23 DIAGNOSIS — I10 Essential (primary) hypertension: Secondary | ICD-10-CM

## 2012-12-07 DIAGNOSIS — R609 Edema, unspecified: Secondary | ICD-10-CM | POA: Diagnosis not present

## 2012-12-07 DIAGNOSIS — M79609 Pain in unspecified limb: Secondary | ICD-10-CM | POA: Diagnosis not present

## 2012-12-07 DIAGNOSIS — B351 Tinea unguium: Secondary | ICD-10-CM | POA: Diagnosis not present

## 2012-12-07 DIAGNOSIS — G589 Mononeuropathy, unspecified: Secondary | ICD-10-CM | POA: Diagnosis not present

## 2013-01-11 DIAGNOSIS — R609 Edema, unspecified: Secondary | ICD-10-CM | POA: Diagnosis not present

## 2013-01-11 DIAGNOSIS — E782 Mixed hyperlipidemia: Secondary | ICD-10-CM | POA: Diagnosis not present

## 2013-01-11 DIAGNOSIS — M1990 Unspecified osteoarthritis, unspecified site: Secondary | ICD-10-CM | POA: Diagnosis not present

## 2013-01-11 DIAGNOSIS — I10 Essential (primary) hypertension: Secondary | ICD-10-CM | POA: Diagnosis not present

## 2013-03-08 DIAGNOSIS — G5760 Lesion of plantar nerve, unspecified lower limb: Secondary | ICD-10-CM | POA: Diagnosis not present

## 2013-03-08 DIAGNOSIS — M79609 Pain in unspecified limb: Secondary | ICD-10-CM | POA: Diagnosis not present

## 2013-03-08 DIAGNOSIS — B351 Tinea unguium: Secondary | ICD-10-CM | POA: Diagnosis not present

## 2013-03-31 DIAGNOSIS — H16229 Keratoconjunctivitis sicca, not specified as Sjogren's, unspecified eye: Secondary | ICD-10-CM | POA: Diagnosis not present

## 2013-03-31 DIAGNOSIS — H40039 Anatomical narrow angle, unspecified eye: Secondary | ICD-10-CM | POA: Diagnosis not present

## 2013-06-07 DIAGNOSIS — B351 Tinea unguium: Secondary | ICD-10-CM | POA: Diagnosis not present

## 2013-06-07 DIAGNOSIS — M79609 Pain in unspecified limb: Secondary | ICD-10-CM | POA: Diagnosis not present

## 2013-06-23 ENCOUNTER — Ambulatory Visit (INDEPENDENT_AMBULATORY_CARE_PROVIDER_SITE_OTHER): Payer: Medicare Other | Admitting: Ophthalmology

## 2013-07-01 ENCOUNTER — Ambulatory Visit (INDEPENDENT_AMBULATORY_CARE_PROVIDER_SITE_OTHER): Payer: Medicare Other | Admitting: Ophthalmology

## 2013-07-01 DIAGNOSIS — H35349 Macular cyst, hole, or pseudohole, unspecified eye: Secondary | ICD-10-CM

## 2013-07-01 DIAGNOSIS — H35039 Hypertensive retinopathy, unspecified eye: Secondary | ICD-10-CM

## 2013-07-01 DIAGNOSIS — H43819 Vitreous degeneration, unspecified eye: Secondary | ICD-10-CM

## 2013-07-01 DIAGNOSIS — H35379 Puckering of macula, unspecified eye: Secondary | ICD-10-CM

## 2013-07-01 DIAGNOSIS — I10 Essential (primary) hypertension: Secondary | ICD-10-CM | POA: Diagnosis not present

## 2013-07-13 DIAGNOSIS — C61 Malignant neoplasm of prostate: Secondary | ICD-10-CM | POA: Diagnosis not present

## 2013-07-15 DIAGNOSIS — Z6833 Body mass index (BMI) 33.0-33.9, adult: Secondary | ICD-10-CM | POA: Diagnosis not present

## 2013-07-15 DIAGNOSIS — E782 Mixed hyperlipidemia: Secondary | ICD-10-CM | POA: Diagnosis not present

## 2013-07-15 DIAGNOSIS — M1990 Unspecified osteoarthritis, unspecified site: Secondary | ICD-10-CM | POA: Diagnosis not present

## 2013-07-15 DIAGNOSIS — I129 Hypertensive chronic kidney disease with stage 1 through stage 4 chronic kidney disease, or unspecified chronic kidney disease: Secondary | ICD-10-CM | POA: Diagnosis not present

## 2013-07-15 DIAGNOSIS — Z1331 Encounter for screening for depression: Secondary | ICD-10-CM | POA: Diagnosis not present

## 2013-07-15 DIAGNOSIS — E669 Obesity, unspecified: Secondary | ICD-10-CM | POA: Diagnosis not present

## 2013-07-15 DIAGNOSIS — R609 Edema, unspecified: Secondary | ICD-10-CM | POA: Diagnosis not present

## 2013-07-20 DIAGNOSIS — C61 Malignant neoplasm of prostate: Secondary | ICD-10-CM | POA: Diagnosis not present

## 2013-08-30 ENCOUNTER — Ambulatory Visit: Payer: Medicare Other | Admitting: Podiatry

## 2013-09-23 ENCOUNTER — Encounter: Payer: Self-pay | Admitting: Podiatry

## 2013-09-23 ENCOUNTER — Ambulatory Visit (INDEPENDENT_AMBULATORY_CARE_PROVIDER_SITE_OTHER): Payer: Medicare Other | Admitting: Podiatry

## 2013-09-23 VITALS — BP 156/86 | HR 82 | Resp 12 | Ht 71.0 in | Wt 220.0 lb

## 2013-09-23 DIAGNOSIS — B351 Tinea unguium: Secondary | ICD-10-CM | POA: Diagnosis not present

## 2013-09-23 DIAGNOSIS — M79609 Pain in unspecified limb: Secondary | ICD-10-CM | POA: Diagnosis not present

## 2013-09-26 NOTE — Progress Notes (Signed)
Subjective:     Patient ID: Alan Ewing, male   DOB: Sep 06, 1925, 79 y.o.   MRN: [id number]  HPI patient is found to have nail disease with thickness and discomfort 1-5 of both feet   Review of Systems     Objective:   Physical Exam Neurovascular status unchanged well oriented x3 with nail disease and thickness 1-5 of both feet that are painful when pressed    Assessment:     Mycotic nail infection with pain 1-5 both feet    Plan:     Debridement of painful nailbeds 1-5 both feet with no iatrogenic bleeding noted

## 2013-11-30 DIAGNOSIS — M999 Biomechanical lesion, unspecified: Secondary | ICD-10-CM | POA: Diagnosis not present

## 2013-11-30 DIAGNOSIS — M5137 Other intervertebral disc degeneration, lumbosacral region: Secondary | ICD-10-CM | POA: Diagnosis not present

## 2013-11-30 DIAGNOSIS — M25559 Pain in unspecified hip: Secondary | ICD-10-CM | POA: Diagnosis not present

## 2013-12-01 DIAGNOSIS — M999 Biomechanical lesion, unspecified: Secondary | ICD-10-CM | POA: Diagnosis not present

## 2013-12-01 DIAGNOSIS — M25559 Pain in unspecified hip: Secondary | ICD-10-CM | POA: Diagnosis not present

## 2013-12-01 DIAGNOSIS — M5137 Other intervertebral disc degeneration, lumbosacral region: Secondary | ICD-10-CM | POA: Diagnosis not present

## 2013-12-02 DIAGNOSIS — M5137 Other intervertebral disc degeneration, lumbosacral region: Secondary | ICD-10-CM | POA: Diagnosis not present

## 2013-12-02 DIAGNOSIS — M25559 Pain in unspecified hip: Secondary | ICD-10-CM | POA: Diagnosis not present

## 2013-12-02 DIAGNOSIS — M999 Biomechanical lesion, unspecified: Secondary | ICD-10-CM | POA: Diagnosis not present

## 2013-12-03 DIAGNOSIS — M999 Biomechanical lesion, unspecified: Secondary | ICD-10-CM | POA: Diagnosis not present

## 2013-12-03 DIAGNOSIS — M25559 Pain in unspecified hip: Secondary | ICD-10-CM | POA: Diagnosis not present

## 2013-12-03 DIAGNOSIS — M5137 Other intervertebral disc degeneration, lumbosacral region: Secondary | ICD-10-CM | POA: Diagnosis not present

## 2013-12-06 DIAGNOSIS — M25559 Pain in unspecified hip: Secondary | ICD-10-CM | POA: Diagnosis not present

## 2013-12-06 DIAGNOSIS — M999 Biomechanical lesion, unspecified: Secondary | ICD-10-CM | POA: Diagnosis not present

## 2013-12-06 DIAGNOSIS — M5137 Other intervertebral disc degeneration, lumbosacral region: Secondary | ICD-10-CM | POA: Diagnosis not present

## 2013-12-07 DIAGNOSIS — M5137 Other intervertebral disc degeneration, lumbosacral region: Secondary | ICD-10-CM | POA: Diagnosis not present

## 2013-12-07 DIAGNOSIS — M25559 Pain in unspecified hip: Secondary | ICD-10-CM | POA: Diagnosis not present

## 2013-12-07 DIAGNOSIS — M999 Biomechanical lesion, unspecified: Secondary | ICD-10-CM | POA: Diagnosis not present

## 2013-12-09 DIAGNOSIS — M5137 Other intervertebral disc degeneration, lumbosacral region: Secondary | ICD-10-CM | POA: Diagnosis not present

## 2013-12-09 DIAGNOSIS — M999 Biomechanical lesion, unspecified: Secondary | ICD-10-CM | POA: Diagnosis not present

## 2013-12-09 DIAGNOSIS — M25559 Pain in unspecified hip: Secondary | ICD-10-CM | POA: Diagnosis not present

## 2013-12-13 DIAGNOSIS — M5137 Other intervertebral disc degeneration, lumbosacral region: Secondary | ICD-10-CM | POA: Diagnosis not present

## 2013-12-13 DIAGNOSIS — M999 Biomechanical lesion, unspecified: Secondary | ICD-10-CM | POA: Diagnosis not present

## 2013-12-13 DIAGNOSIS — M25559 Pain in unspecified hip: Secondary | ICD-10-CM | POA: Diagnosis not present

## 2013-12-14 DIAGNOSIS — M5137 Other intervertebral disc degeneration, lumbosacral region: Secondary | ICD-10-CM | POA: Diagnosis not present

## 2013-12-14 DIAGNOSIS — M999 Biomechanical lesion, unspecified: Secondary | ICD-10-CM | POA: Diagnosis not present

## 2013-12-14 DIAGNOSIS — M25559 Pain in unspecified hip: Secondary | ICD-10-CM | POA: Diagnosis not present

## 2013-12-16 DIAGNOSIS — M25559 Pain in unspecified hip: Secondary | ICD-10-CM | POA: Diagnosis not present

## 2013-12-16 DIAGNOSIS — M5137 Other intervertebral disc degeneration, lumbosacral region: Secondary | ICD-10-CM | POA: Diagnosis not present

## 2013-12-16 DIAGNOSIS — M999 Biomechanical lesion, unspecified: Secondary | ICD-10-CM | POA: Diagnosis not present

## 2013-12-20 DIAGNOSIS — M5137 Other intervertebral disc degeneration, lumbosacral region: Secondary | ICD-10-CM | POA: Diagnosis not present

## 2013-12-20 DIAGNOSIS — M999 Biomechanical lesion, unspecified: Secondary | ICD-10-CM | POA: Diagnosis not present

## 2013-12-20 DIAGNOSIS — M25559 Pain in unspecified hip: Secondary | ICD-10-CM | POA: Diagnosis not present

## 2013-12-21 DIAGNOSIS — M5137 Other intervertebral disc degeneration, lumbosacral region: Secondary | ICD-10-CM | POA: Diagnosis not present

## 2013-12-21 DIAGNOSIS — M25559 Pain in unspecified hip: Secondary | ICD-10-CM | POA: Diagnosis not present

## 2013-12-21 DIAGNOSIS — M999 Biomechanical lesion, unspecified: Secondary | ICD-10-CM | POA: Diagnosis not present

## 2013-12-23 ENCOUNTER — Encounter: Payer: Self-pay | Admitting: Podiatry

## 2013-12-23 ENCOUNTER — Ambulatory Visit (INDEPENDENT_AMBULATORY_CARE_PROVIDER_SITE_OTHER): Payer: Medicare Other | Admitting: Podiatry

## 2013-12-23 DIAGNOSIS — M999 Biomechanical lesion, unspecified: Secondary | ICD-10-CM | POA: Diagnosis not present

## 2013-12-23 DIAGNOSIS — M5137 Other intervertebral disc degeneration, lumbosacral region: Secondary | ICD-10-CM | POA: Diagnosis not present

## 2013-12-23 DIAGNOSIS — M79609 Pain in unspecified limb: Secondary | ICD-10-CM

## 2013-12-23 DIAGNOSIS — B351 Tinea unguium: Secondary | ICD-10-CM | POA: Diagnosis not present

## 2013-12-23 DIAGNOSIS — M25559 Pain in unspecified hip: Secondary | ICD-10-CM | POA: Diagnosis not present

## 2013-12-24 NOTE — Progress Notes (Signed)
Subjective:     Patient ID: Alan Ewing, male   DOB: 01-Mar-1925, 80 y.o.   MRN: [id number]  HPI patient presents with painful nailbeds 1-5 both feet that are thick and impossible for patient to cut   Review of Systems     Objective:   Physical Exam Neurovascular status intact no health history changes noted with thick nailbeds 1-5 both feet that are painful    Assessment:     Mycotic nail infection with pain 1-5 both feet    Plan:     Debridement painful nail bed 1-5 both feet with no iatrogenic bleeding noted

## 2013-12-27 DIAGNOSIS — M25559 Pain in unspecified hip: Secondary | ICD-10-CM | POA: Diagnosis not present

## 2013-12-27 DIAGNOSIS — M5137 Other intervertebral disc degeneration, lumbosacral region: Secondary | ICD-10-CM | POA: Diagnosis not present

## 2013-12-27 DIAGNOSIS — M999 Biomechanical lesion, unspecified: Secondary | ICD-10-CM | POA: Diagnosis not present

## 2013-12-30 DIAGNOSIS — M5137 Other intervertebral disc degeneration, lumbosacral region: Secondary | ICD-10-CM | POA: Diagnosis not present

## 2013-12-30 DIAGNOSIS — M25559 Pain in unspecified hip: Secondary | ICD-10-CM | POA: Diagnosis not present

## 2013-12-30 DIAGNOSIS — M999 Biomechanical lesion, unspecified: Secondary | ICD-10-CM | POA: Diagnosis not present

## 2014-01-03 DIAGNOSIS — M25559 Pain in unspecified hip: Secondary | ICD-10-CM | POA: Diagnosis not present

## 2014-01-03 DIAGNOSIS — M999 Biomechanical lesion, unspecified: Secondary | ICD-10-CM | POA: Diagnosis not present

## 2014-01-03 DIAGNOSIS — M5137 Other intervertebral disc degeneration, lumbosacral region: Secondary | ICD-10-CM | POA: Diagnosis not present

## 2014-01-10 DIAGNOSIS — M999 Biomechanical lesion, unspecified: Secondary | ICD-10-CM | POA: Diagnosis not present

## 2014-01-10 DIAGNOSIS — M25559 Pain in unspecified hip: Secondary | ICD-10-CM | POA: Diagnosis not present

## 2014-01-10 DIAGNOSIS — M5137 Other intervertebral disc degeneration, lumbosacral region: Secondary | ICD-10-CM | POA: Diagnosis not present

## 2014-01-13 DIAGNOSIS — N183 Chronic kidney disease, stage 3 unspecified: Secondary | ICD-10-CM | POA: Diagnosis not present

## 2014-01-13 DIAGNOSIS — I129 Hypertensive chronic kidney disease with stage 1 through stage 4 chronic kidney disease, or unspecified chronic kidney disease: Secondary | ICD-10-CM | POA: Diagnosis not present

## 2014-01-13 DIAGNOSIS — M25559 Pain in unspecified hip: Secondary | ICD-10-CM | POA: Diagnosis not present

## 2014-01-13 DIAGNOSIS — M999 Biomechanical lesion, unspecified: Secondary | ICD-10-CM | POA: Diagnosis not present

## 2014-01-13 DIAGNOSIS — R809 Proteinuria, unspecified: Secondary | ICD-10-CM | POA: Diagnosis not present

## 2014-01-13 DIAGNOSIS — M1990 Unspecified osteoarthritis, unspecified site: Secondary | ICD-10-CM | POA: Diagnosis not present

## 2014-01-13 DIAGNOSIS — M5137 Other intervertebral disc degeneration, lumbosacral region: Secondary | ICD-10-CM | POA: Diagnosis not present

## 2014-01-13 DIAGNOSIS — R609 Edema, unspecified: Secondary | ICD-10-CM | POA: Diagnosis not present

## 2014-01-13 DIAGNOSIS — Z23 Encounter for immunization: Secondary | ICD-10-CM | POA: Diagnosis not present

## 2014-01-13 DIAGNOSIS — E782 Mixed hyperlipidemia: Secondary | ICD-10-CM | POA: Diagnosis not present

## 2014-01-20 DIAGNOSIS — M5137 Other intervertebral disc degeneration, lumbosacral region: Secondary | ICD-10-CM | POA: Diagnosis not present

## 2014-01-20 DIAGNOSIS — M25559 Pain in unspecified hip: Secondary | ICD-10-CM | POA: Diagnosis not present

## 2014-01-20 DIAGNOSIS — M999 Biomechanical lesion, unspecified: Secondary | ICD-10-CM | POA: Diagnosis not present

## 2014-01-21 DIAGNOSIS — N183 Chronic kidney disease, stage 3 unspecified: Secondary | ICD-10-CM | POA: Diagnosis not present

## 2014-01-21 DIAGNOSIS — I129 Hypertensive chronic kidney disease with stage 1 through stage 4 chronic kidney disease, or unspecified chronic kidney disease: Secondary | ICD-10-CM | POA: Diagnosis not present

## 2014-01-21 DIAGNOSIS — R609 Edema, unspecified: Secondary | ICD-10-CM | POA: Diagnosis not present

## 2014-01-24 ENCOUNTER — Ambulatory Visit (HOSPITAL_COMMUNITY)
Admission: RE | Admit: 2014-01-24 | Discharge: 2014-01-24 | Disposition: A | Discharge status: Home / Self Care | Payer: Medicare Other | Source: Ambulatory Visit | Attending: Cardiovascular Disease | Admitting: Cardiovascular Disease

## 2014-01-24 ENCOUNTER — Other Ambulatory Visit (HOSPITAL_COMMUNITY): Payer: Self-pay | Admitting: Family Medicine

## 2014-01-24 DIAGNOSIS — R600 Localized edema: Secondary | ICD-10-CM

## 2014-01-24 DIAGNOSIS — R609 Edema, unspecified: Secondary | ICD-10-CM | POA: Insufficient documentation

## 2014-01-24 DIAGNOSIS — I519 Heart disease, unspecified: Secondary | ICD-10-CM

## 2014-01-24 NOTE — Progress Notes (Signed)
2D Echo Performed 01/24/2014    Marygrace Drought, RCS

## 2014-01-31 DIAGNOSIS — I130 Hypertensive heart and chronic kidney disease with heart failure and stage 1 through stage 4 chronic kidney disease, or unspecified chronic kidney disease: Secondary | ICD-10-CM | POA: Diagnosis not present

## 2014-01-31 DIAGNOSIS — I5030 Unspecified diastolic (congestive) heart failure: Secondary | ICD-10-CM | POA: Diagnosis not present

## 2014-01-31 DIAGNOSIS — N039 Chronic nephritic syndrome with unspecified morphologic changes: Secondary | ICD-10-CM | POA: Diagnosis not present

## 2014-01-31 DIAGNOSIS — R609 Edema, unspecified: Secondary | ICD-10-CM | POA: Diagnosis not present

## 2014-01-31 DIAGNOSIS — N183 Chronic kidney disease, stage 3 unspecified: Secondary | ICD-10-CM | POA: Diagnosis not present

## 2014-02-03 DIAGNOSIS — M25559 Pain in unspecified hip: Secondary | ICD-10-CM | POA: Diagnosis not present

## 2014-02-03 DIAGNOSIS — M999 Biomechanical lesion, unspecified: Secondary | ICD-10-CM | POA: Diagnosis not present

## 2014-02-03 DIAGNOSIS — M5137 Other intervertebral disc degeneration, lumbosacral region: Secondary | ICD-10-CM | POA: Diagnosis not present

## 2014-03-17 ENCOUNTER — Encounter: Payer: Self-pay | Admitting: Podiatry

## 2014-03-17 ENCOUNTER — Ambulatory Visit (INDEPENDENT_AMBULATORY_CARE_PROVIDER_SITE_OTHER): Payer: Medicare Other | Admitting: Podiatry

## 2014-03-17 DIAGNOSIS — B351 Tinea unguium: Secondary | ICD-10-CM | POA: Diagnosis not present

## 2014-03-17 DIAGNOSIS — M79609 Pain in unspecified limb: Secondary | ICD-10-CM

## 2014-03-18 NOTE — Progress Notes (Signed)
Subjective:     Patient ID: Alan Ewing, male   DOB: [birth year], 80 y.o.   MRN: [id number]  HPI patient presents with thick nail disease that are painful and impossible for him to cut or see of both feet  Review of Systems     Objective:   Physical Exam Neurovascular status unchanged with mycotic thick yellow brittle nails 1-5 both feet    Assessment:     Mycotic nail infection with pain 1-5 both feet    Plan:     Debridement painful nailbeds 1-5 both feet with no iatrogenic bleeding noted

## 2014-03-24 ENCOUNTER — Ambulatory Visit: Payer: Medicare Other | Admitting: Podiatry

## 2014-03-30 ENCOUNTER — Other Ambulatory Visit: Payer: Self-pay | Admitting: Podiatry

## 2014-06-14 DIAGNOSIS — M79609 Pain in unspecified limb: Secondary | ICD-10-CM | POA: Diagnosis not present

## 2014-06-14 DIAGNOSIS — R0602 Shortness of breath: Secondary | ICD-10-CM | POA: Diagnosis not present

## 2014-06-14 DIAGNOSIS — E7800 Pure hypercholesterolemia, unspecified: Secondary | ICD-10-CM | POA: Diagnosis not present

## 2014-06-14 DIAGNOSIS — M25579 Pain in unspecified ankle and joints of unspecified foot: Secondary | ICD-10-CM | POA: Diagnosis not present

## 2014-06-14 DIAGNOSIS — Z79899 Other long term (current) drug therapy: Secondary | ICD-10-CM | POA: Diagnosis not present

## 2014-06-14 DIAGNOSIS — R609 Edema, unspecified: Secondary | ICD-10-CM | POA: Diagnosis not present

## 2014-06-14 DIAGNOSIS — R209 Unspecified disturbances of skin sensation: Secondary | ICD-10-CM | POA: Diagnosis not present

## 2014-06-14 DIAGNOSIS — I10 Essential (primary) hypertension: Secondary | ICD-10-CM | POA: Diagnosis not present

## 2014-06-14 DIAGNOSIS — M7989 Other specified soft tissue disorders: Secondary | ICD-10-CM | POA: Diagnosis not present

## 2014-06-16 ENCOUNTER — Ambulatory Visit (INDEPENDENT_AMBULATORY_CARE_PROVIDER_SITE_OTHER): Payer: Medicare Other | Admitting: Podiatry

## 2014-06-16 DIAGNOSIS — B351 Tinea unguium: Secondary | ICD-10-CM

## 2014-06-16 DIAGNOSIS — M79609 Pain in unspecified limb: Secondary | ICD-10-CM | POA: Diagnosis not present

## 2014-06-16 DIAGNOSIS — M79673 Pain in unspecified foot: Secondary | ICD-10-CM

## 2014-06-16 NOTE — Progress Notes (Signed)
   Subjective:    Patient ID: Alan Ewing, male    DOB: [birth year], 80 y.o.   MRN: [id number]  HPI Pt presents for nail debridement   Review of Systems     Objective:   Physical Exam        Assessment & Plan:

## 2014-06-17 NOTE — Progress Notes (Signed)
Subjective:     Patient ID: Alan Ewing, male   DOB: 1925/01/21, 80 y.o.   MRN: [id number]  HPI patient presents with thick nailbeds 1-5 both feet that are painful when pressed   Review of Systems     Objective:   Physical Exam Neurovascular status intact with thick yellow brittle nailbeds 1-5 both feet    Assessment:     Mycotic nail infection is with pain 1-5 both feet    Plan:     Debris painful nailbeds 1-5 both feet with no iatrogenic bleeding noted

## 2014-07-20 DIAGNOSIS — C61 Malignant neoplasm of prostate: Secondary | ICD-10-CM | POA: Diagnosis not present

## 2014-07-22 DIAGNOSIS — R809 Proteinuria, unspecified: Secondary | ICD-10-CM | POA: Diagnosis not present

## 2014-07-22 DIAGNOSIS — G609 Hereditary and idiopathic neuropathy, unspecified: Secondary | ICD-10-CM | POA: Diagnosis not present

## 2014-07-22 DIAGNOSIS — N183 Chronic kidney disease, stage 3 unspecified: Secondary | ICD-10-CM | POA: Diagnosis not present

## 2014-07-22 DIAGNOSIS — R609 Edema, unspecified: Secondary | ICD-10-CM | POA: Diagnosis not present

## 2014-07-22 DIAGNOSIS — D649 Anemia, unspecified: Secondary | ICD-10-CM | POA: Diagnosis not present

## 2014-07-22 DIAGNOSIS — E782 Mixed hyperlipidemia: Secondary | ICD-10-CM | POA: Diagnosis not present

## 2014-07-22 DIAGNOSIS — M1990 Unspecified osteoarthritis, unspecified site: Secondary | ICD-10-CM | POA: Diagnosis not present

## 2014-07-22 DIAGNOSIS — I130 Hypertensive heart and chronic kidney disease with heart failure and stage 1 through stage 4 chronic kidney disease, or unspecified chronic kidney disease: Secondary | ICD-10-CM | POA: Diagnosis not present

## 2014-07-22 DIAGNOSIS — N039 Chronic nephritic syndrome with unspecified morphologic changes: Secondary | ICD-10-CM | POA: Diagnosis not present

## 2014-07-22 DIAGNOSIS — Z23 Encounter for immunization: Secondary | ICD-10-CM | POA: Diagnosis not present

## 2014-07-22 DIAGNOSIS — I5030 Unspecified diastolic (congestive) heart failure: Secondary | ICD-10-CM | POA: Diagnosis not present

## 2014-07-25 DIAGNOSIS — C61 Malignant neoplasm of prostate: Secondary | ICD-10-CM | POA: Diagnosis not present

## 2014-08-08 ENCOUNTER — Ambulatory Visit (INDEPENDENT_AMBULATORY_CARE_PROVIDER_SITE_OTHER): Payer: Medicare Other | Admitting: Ophthalmology

## 2014-09-19 ENCOUNTER — Encounter: Payer: Medicare Other | Admitting: Podiatry

## 2014-09-21 NOTE — Progress Notes (Signed)
This encounter was created in error - please disregard.

## 2014-10-14 ENCOUNTER — Encounter: Payer: Self-pay | Admitting: *Deleted

## 2014-10-17 ENCOUNTER — Encounter: Payer: Self-pay | Admitting: Neurology

## 2014-10-17 ENCOUNTER — Ambulatory Visit (INDEPENDENT_AMBULATORY_CARE_PROVIDER_SITE_OTHER): Payer: Medicare Other | Admitting: Neurology

## 2014-10-17 VITALS — BP 163/77 | HR 71 | Ht 70.5 in | Wt 226.2 lb

## 2014-10-17 DIAGNOSIS — G609 Hereditary and idiopathic neuropathy, unspecified: Secondary | ICD-10-CM

## 2014-10-17 DIAGNOSIS — E538 Deficiency of other specified B group vitamins: Secondary | ICD-10-CM

## 2014-10-17 MED ORDER — CARBAMAZEPINE 200 MG PO TABS: 200.0000 mg | ORAL_TABLET | Freq: Three times a day (TID) | ORAL | Status: AC

## 2014-10-17 NOTE — Patient Instructions (Signed)
Overall you are doing fairly well but I do want to suggest a few things today:   Remember to drink plenty of fluid, eat healthy meals and do not skip any meals. Try to eat protein with a every meal and eat a healthy snack such as fruit or nuts in between meals. Try to keep a regular sleep-wake schedule and try to exercise daily, particularly in the form of walking, 20-30 minutes a day, if you can.   As far as your medications are concerned, I would like to suggest; Carbamazepine 200mg . Take up to three times daily but start slowly, once daily, to look for side effects and increase slowly. Labs today as well.  I would like to see you back in 3 months, sooner if we need to. Please call us with any interim questions, concerns, problems, updates or refill requests.   Please also call us for any test results so we can go over those with you on the phone.  My clinical assistant and will answer any of your questions and relay your messages to me and also relay most of my messages to you.   Our phone number is [phone]. We also have an after hours call service for urgent matters and there is a physician on-call for urgent questions. For any emergencies you know to call 911 or go to the nearest emergency room

## 2014-10-17 NOTE — Progress Notes (Signed)
[facility]    Provider:  Dr Jaynee Eagles Referring Provider: Mayra Neer, MD Primary Care Physician:  Mayra Neer, MD  CC:  Pain in the feet  HPI:  Alan Ewing is a 80 y.o. male here as a referral from Dr. Brigitte Pulse for neuropathy  Symptoms started years ago, 2001. He has a PMHx of HLD, osteoarthritis, CKD, diastolic heart failure, HTN, depression, anemia of chronic disease, prostate cancer had seeds placed but no other chemo or radiation. He has burning in the feet and legs. Progressive. Cramps in the toes. Worse at night when laying in bed. Used to hurt only when walking but has progressed and hurts all the time. Neurontin helped a lot but it caused a lot of swelling. Stopped due to side effects. The pain is 10/10. Tried Lyrica but was too expensive and helped but caused the swelling as well. Has not tried cymbalta, it may be expensive. Balance is ok. As far as he knows he never had B12 deficiency. He uses walking aid. No FHx of neuromuscular disorders or neuropathy.  Reviewed notes, labs and imaging from outside physicians, which showed: he takes Norco for OA pain. On lasix for heart failure. He was seen in the ED for swelling in legs thought to be due to gabapentin use. Lyrica was too expensive and also caused swelling. CBC with anemia hgb 11.7. CMP creatinine 1.53, GFR 52, otherwise CMP unremarkable.   Review of Systems: Patient complains of symptoms per HPI as well as the following symptoms: swelling in legs, snoring, hearing loss, spinning sensation, constipation, cramps, aching mucles, urination problems, allergies, runny nose, numbness, weakness, dizziness, restless legs. Pertinent negatives per HPI. All others negative.    History   Social History  . Marital Status: Widowed    Spouse Name: N/A    Number of Children: 4  . Years of Education: 12   Occupational History  . Retired Corporate treasurer     Social History Main Topics  . Smoking status: Former Research scientist (life sciences)  .  Smokeless tobacco: Never Used  . Alcohol Use: 0.0 oz/week    0 Not specified per week     Comment: occasional beers  . Drug Use: Not on file  . Sexual Activity: Not on file   Other Topics Concern  . 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 Diabetes type II Father   . Diabetes type II Brother     1  . Hypertension Father   . Hypertension Brother     1    Past Medical History  Diagnosis Date  . Hypertension   . Seasonal allergies   . Arthritis   . Cancer 2001    prostate cancer, seeds implanted  . Hyperlipemia   . Prostate cancer   . Chronic renal insufficiency   . Osteoarthritis   . Anemia of chronic disease   . Colon adenoma   . Diverticulosis     Past Surgical History  Procedure Laterality Date  . Appendectomy    . Hernia repair      x 2  . Insertion prostate radiation seed    . Eye surgery      L eye cataract surgery  .  Pars plana vitrectomy  12/24/2011    Procedure: PARS PLANA VITRECTOMY WITH 25 GAUGE;  Surgeon: Hayden Pedro, MD;  Location: [place];  Service: Ophthalmology;  Laterality: Left;  . Gas/fluid exchange  12/24/2011    Procedure: GAS/FLUID EXCHANGE;  Surgeon: Hayden Pedro, MD;  Location: [place];  Service: Ophthalmology;  Laterality: Left;  . Gas insertion  12/24/2011    Procedure: INSERTION OF GAS;  Surgeon: Hayden Pedro, MD;  Location: [place];  Service: Ophthalmology;  Laterality: Left;  C3F8    Current Outpatient Prescriptions  Medication Sig Dispense Refill  . gabapentin (NEURONTIN) 400 MG capsule TAKE ONE CAPSULE BY MOUTH TWICE A DAY 180 capsule 2  . HYDROcodone-acetaminophen (NORCO) 10-325 MG per tablet Take 0.5-1 tablets by mouth every 6 (six) hours as needed. As needed for pain.    Marland Kitchen LYRICA 50 MG capsule     .  pravastatin (PRAVACHOL) 10 MG tablet Take 10 mg by mouth at bedtime.    . triamterene-hydrochlorothiazide (MAXZIDE-25) 37.5-25 MG per tablet Take 2 tablets by mouth daily.     No current facility-administered medications for this visit.    Allergies as of 10/17/2014  . (No Known Allergies)    Vitals: BP 163/77 mmHg  Pulse 71  Ht 5' 10.5" (1.791 m)  Wt 226 lb 3.2 oz (102.604 kg)  BMI 31.99 kg/m2 Last Weight:  Wt Readings from Last 1 Encounters:  10/17/14 226 lb 3.2 oz (102.604 kg)   Last Height:   Ht Readings from Last 1 Encounters:  10/17/14 5' 10.5" (1.791 m)    Physical exam: Exam: Gen: NAD, conversant, well nourised, well groomed                     CV: RRR, no MRG. No Carotid Bruits. No peripheral edema, warm, nontender Eyes: Conjunctivae clear without exudates or hemorrhage  Neuro: Detailed Neurologic Exam  Speech:    Speech is normal; fluent and spontaneous with normal comprehension.  Cognition:    The patient is oriented to person, place, and time;     recent and remote memory intact;     language fluent;     normal attention, concentration,     fund of knowledge Cranial Nerves:    The pupils are equal, round, and reactive to light. The fundi are flat Visual fields are full to finger confrontation. Extraocular movements are intact. Trigeminal sensation is intact and the muscles of mastication are normal. The face is symmetric. The palate elevates in the midline. Voice is normal. Shoulder shrug is normal. The tongue has normal motion without fasciculations.   Coordination:    Normal finger to nose and heel to shin. Normal rapid alternating movements.   Gait:    Wide-based   Motor Observation:    No involuntary movements noted. Tone:    Normal muscle tone.    Posture:    Posture is normal.     Strength:Mild weakness in lef hip flexion and HS Otherwise strength is V/V in the upper and lower limbs.      Sensation:     impaired to all sensation in  glove and stocking distribution  Reflex Exam:  DTR's:    Absent ankle jerks. Otherwise deep tendon reflexes in the upper and lower extremities are normal bilaterally.   Toes:    The toes are equivocal bilaterally.   Clonus:    Clonus is absent.  Assessment/Plan:  80 year old lovely male with a  PMHx of  HLD, osteoarthritis, CKD(GFR 52), diastolic heart failure, HTN, depression, anemia of chronic disease, prostate cancer (had seeds placed but no other chemo or radiation) here for painful neuropathy. He has progressive, severe 10/10 burning in the feet and legs with painful cramps in the toes. Worse at night when laying in bed. Neurontin and Lyrice helped a lot but it caused a lot of swelling. Has not tried cymbalta because he can't afford it. He uses walking aid. Exam shows decrease to all modalities in the feet and hands, glove-and-stocking distribution. Appears quite significant loss of sensation without explanation such as diabetes or B12 deficiency. Will do a thorough neuropathy screen. Neurontin helped but caused swelling and he can't afford Lyrica or Cymbalta. Will try Carbamazepine but discussed the serious side effects this medication can have including bone marow depression, rash, arrythmia, hyponatremia as well as dizziness, drowsiness, unsteadiness, tremor and others but feel the risk is worth the benefit as his pain is severe. Can try Venlafaxine if he has adverse side effects to Carbamazepine. Follow up in 6 weeks.  Can consider EMG/NCS if all results on screen are negative.   Sarina Ill, MD  [facility] [address] [place], [place] [address]  Phone [phone] Fax [phone]

## 2014-10-19 LAB — IFE AND PE, SERUM
ALBUMIN SERPL ELPH-MCNC: 4.3 g/dL (ref 3.2–5.6)
ALPHA 1: 0.2 g/dL (ref 0.1–0.4)
Albumin/Glob SerPl: 1.4 (ref 0.7–2.0)
Alpha2 Glob SerPl Elph-Mcnc: 0.8 g/dL (ref 0.4–1.2)
B-GLOBULIN SERPL ELPH-MCNC: 1.2 g/dL (ref 0.6–1.3)
GAMMA GLOB SERPL ELPH-MCNC: 1 g/dL (ref 0.5–1.6)
Globulin, Total: 3.3 g/dL (ref 2.0–4.5)
IGA/IMMUNOGLOBULIN A, SERUM: 357 mg/dL (ref 91–414)
IgG (Immunoglobin G), Serum: 1140 mg/dL (ref 700–1600)
IgM (Immunoglobulin M), Srm: 77 mg/dL (ref 40–230)
TOTAL PROTEIN: 7.6 g/dL (ref 6.0–8.5)

## 2014-10-19 LAB — RHEUMATOID FACTOR: Rhuematoid fact SerPl-aCnc: 65.1 IU/mL — ABNORMAL HIGH (ref 0.0–13.9)

## 2014-10-19 LAB — RPR: RPR: NONREACTIVE

## 2014-10-19 LAB — PAN-ANCA
ANCA Proteinase 3: <3.5 U/mL (ref 0.0–3.5)
Atypical pANCA: <1:20 {titer}

## 2014-10-19 LAB — VITAMIN B12: VITAMIN B 12: 1271 pg/mL — AB (ref 211–946)

## 2014-10-19 LAB — ANA W/REFLEX: ANA: NEGATIVE

## 2014-10-19 LAB — METHYLMALONIC ACID, SERUM: METHYLMALONIC ACID: 183 nmol/L (ref 0–378)

## 2014-10-19 LAB — ANGIOTENSIN CONVERTING ENZYME: Angio Convert Enzyme: 41 U/L (ref 14–82)

## 2014-10-19 LAB — TSH: TSH: 2.5 u[IU]/mL (ref 0.450–4.500)

## 2014-10-23 ENCOUNTER — Encounter: Payer: Self-pay | Admitting: Neurology

## 2014-10-23 DIAGNOSIS — G609 Hereditary and idiopathic neuropathy, unspecified: Secondary | ICD-10-CM | POA: Insufficient documentation

## 2014-10-24 ENCOUNTER — Other Ambulatory Visit: Payer: Self-pay | Admitting: Neurology

## 2014-10-25 ENCOUNTER — Other Ambulatory Visit: Payer: Self-pay | Admitting: Neurology

## 2014-10-25 ENCOUNTER — Telehealth: Payer: Self-pay | Admitting: Neurology

## 2014-10-25 DIAGNOSIS — G609 Hereditary and idiopathic neuropathy, unspecified: Secondary | ICD-10-CM

## 2014-10-25 DIAGNOSIS — R768 Other specified abnormal immunological findings in serum: Secondary | ICD-10-CM

## 2014-10-25 NOTE — Telephone Encounter (Signed)
Spoke to patient. Neuropathy workup showed RF of 65.1. Patient has significant painful idiopathic neuropathy. Workup has not revealed many risk factors for neuropathy except elevated RF.  Would like evaluation by Rheumatology. Patient could not afford Lyrica, neurontin gave him peripheral edema, can't afford topical creams for his neuropathy. Trying carbamazepine currently on 200mg  tid. Will slowly increase to see if that helps. If not can research if Cymbalta would be expensive as well.   Janett Billow, is there any way you could please see if Mr. Omahoney insurance would cover Cymbalta? Thank you.

## 2014-10-25 NOTE — Telephone Encounter (Signed)
I called the patient's ins.  Spoke with Carmel Sacramento, who was not able to assist me.  She transferred me to [place].  She reviewed the patient' policy and said generic Cymbalta is covered.  They cannot disclose co-pay amount to [place], but is is a low tier medication.

## 2014-10-27 DIAGNOSIS — M79605 Pain in left leg: Secondary | ICD-10-CM | POA: Diagnosis not present

## 2014-10-27 DIAGNOSIS — M6281 Muscle weakness (generalized): Secondary | ICD-10-CM | POA: Diagnosis not present

## 2014-10-27 DIAGNOSIS — I672 Cerebral atherosclerosis: Secondary | ICD-10-CM | POA: Diagnosis present

## 2014-10-27 DIAGNOSIS — M79604 Pain in right leg: Secondary | ICD-10-CM | POA: Diagnosis not present

## 2014-10-27 DIAGNOSIS — M25562 Pain in left knee: Secondary | ICD-10-CM | POA: Diagnosis not present

## 2014-10-27 DIAGNOSIS — R42 Dizziness and giddiness: Secondary | ICD-10-CM | POA: Diagnosis not present

## 2014-10-27 DIAGNOSIS — I6523 Occlusion and stenosis of bilateral carotid arteries: Secondary | ICD-10-CM | POA: Diagnosis not present

## 2014-10-27 DIAGNOSIS — R279 Unspecified lack of coordination: Secondary | ICD-10-CM | POA: Diagnosis not present

## 2014-10-27 DIAGNOSIS — M1711 Unilateral primary osteoarthritis, right knee: Secondary | ICD-10-CM | POA: Diagnosis not present

## 2014-10-27 DIAGNOSIS — R2689 Other abnormalities of gait and mobility: Secondary | ICD-10-CM | POA: Diagnosis not present

## 2014-10-27 DIAGNOSIS — E1142 Type 2 diabetes mellitus with diabetic polyneuropathy: Secondary | ICD-10-CM | POA: Diagnosis not present

## 2014-10-27 DIAGNOSIS — R2681 Unsteadiness on feet: Secondary | ICD-10-CM | POA: Diagnosis not present

## 2014-10-27 DIAGNOSIS — G450 Vertebro-basilar artery syndrome: Secondary | ICD-10-CM | POA: Diagnosis not present

## 2014-10-27 DIAGNOSIS — I10 Essential (primary) hypertension: Secondary | ICD-10-CM | POA: Diagnosis not present

## 2014-10-27 DIAGNOSIS — M25561 Pain in right knee: Secondary | ICD-10-CM | POA: Diagnosis not present

## 2014-10-27 DIAGNOSIS — I639 Cerebral infarction, unspecified: Secondary | ICD-10-CM | POA: Diagnosis not present

## 2014-10-27 DIAGNOSIS — E1140 Type 2 diabetes mellitus with diabetic neuropathy, unspecified: Secondary | ICD-10-CM | POA: Diagnosis present

## 2014-10-27 DIAGNOSIS — E780 Pure hypercholesterolemia: Secondary | ICD-10-CM | POA: Diagnosis present

## 2014-10-27 DIAGNOSIS — M1712 Unilateral primary osteoarthritis, left knee: Secondary | ICD-10-CM | POA: Diagnosis not present

## 2014-10-28 DIAGNOSIS — R42 Dizziness and giddiness: Secondary | ICD-10-CM | POA: Diagnosis not present

## 2014-10-28 DIAGNOSIS — M25561 Pain in right knee: Secondary | ICD-10-CM | POA: Diagnosis not present

## 2014-10-28 DIAGNOSIS — E1142 Type 2 diabetes mellitus with diabetic polyneuropathy: Secondary | ICD-10-CM | POA: Diagnosis not present

## 2014-10-28 DIAGNOSIS — R2689 Other abnormalities of gait and mobility: Secondary | ICD-10-CM | POA: Diagnosis not present

## 2014-10-29 DIAGNOSIS — R2689 Other abnormalities of gait and mobility: Secondary | ICD-10-CM | POA: Diagnosis not present

## 2014-10-29 DIAGNOSIS — R42 Dizziness and giddiness: Secondary | ICD-10-CM | POA: Diagnosis not present

## 2014-10-29 DIAGNOSIS — E1142 Type 2 diabetes mellitus with diabetic polyneuropathy: Secondary | ICD-10-CM | POA: Diagnosis not present

## 2014-10-29 DIAGNOSIS — M25561 Pain in right knee: Secondary | ICD-10-CM | POA: Diagnosis not present

## 2014-10-30 DIAGNOSIS — R2689 Other abnormalities of gait and mobility: Secondary | ICD-10-CM | POA: Diagnosis not present

## 2014-10-30 DIAGNOSIS — E1142 Type 2 diabetes mellitus with diabetic polyneuropathy: Secondary | ICD-10-CM | POA: Diagnosis not present

## 2014-10-30 DIAGNOSIS — M25561 Pain in right knee: Secondary | ICD-10-CM | POA: Diagnosis not present

## 2014-10-30 DIAGNOSIS — R42 Dizziness and giddiness: Secondary | ICD-10-CM | POA: Diagnosis not present

## 2014-10-31 DIAGNOSIS — G619 Inflammatory polyneuropathy, unspecified: Secondary | ICD-10-CM | POA: Diagnosis not present

## 2014-10-31 DIAGNOSIS — R269 Unspecified abnormalities of gait and mobility: Secondary | ICD-10-CM | POA: Diagnosis not present

## 2014-10-31 DIAGNOSIS — J069 Acute upper respiratory infection, unspecified: Secondary | ICD-10-CM | POA: Diagnosis not present

## 2014-10-31 DIAGNOSIS — H6591 Unspecified nonsuppurative otitis media, right ear: Secondary | ICD-10-CM | POA: Diagnosis not present

## 2014-10-31 DIAGNOSIS — R531 Weakness: Secondary | ICD-10-CM | POA: Diagnosis not present

## 2014-10-31 DIAGNOSIS — I517 Cardiomegaly: Secondary | ICD-10-CM | POA: Diagnosis not present

## 2014-10-31 DIAGNOSIS — I10 Essential (primary) hypertension: Secondary | ICD-10-CM | POA: Diagnosis not present

## 2014-10-31 DIAGNOSIS — I7090 Unspecified atherosclerosis: Secondary | ICD-10-CM | POA: Diagnosis not present

## 2014-10-31 DIAGNOSIS — M25561 Pain in right knee: Secondary | ICD-10-CM | POA: Diagnosis not present

## 2014-10-31 DIAGNOSIS — I672 Cerebral atherosclerosis: Secondary | ICD-10-CM | POA: Diagnosis not present

## 2014-10-31 DIAGNOSIS — E1142 Type 2 diabetes mellitus with diabetic polyneuropathy: Secondary | ICD-10-CM | POA: Diagnosis not present

## 2014-10-31 DIAGNOSIS — M6281 Muscle weakness (generalized): Secondary | ICD-10-CM | POA: Diagnosis not present

## 2014-10-31 DIAGNOSIS — R2689 Other abnormalities of gait and mobility: Secondary | ICD-10-CM | POA: Diagnosis not present

## 2014-10-31 DIAGNOSIS — R2681 Unsteadiness on feet: Secondary | ICD-10-CM | POA: Diagnosis not present

## 2014-10-31 DIAGNOSIS — G609 Hereditary and idiopathic neuropathy, unspecified: Secondary | ICD-10-CM | POA: Diagnosis not present

## 2014-10-31 DIAGNOSIS — R05 Cough: Secondary | ICD-10-CM | POA: Diagnosis not present

## 2014-10-31 DIAGNOSIS — R279 Unspecified lack of coordination: Secondary | ICD-10-CM | POA: Diagnosis not present

## 2014-10-31 DIAGNOSIS — F324 Major depressive disorder, single episode, in partial remission: Secondary | ICD-10-CM | POA: Diagnosis not present

## 2014-10-31 DIAGNOSIS — M25569 Pain in unspecified knee: Secondary | ICD-10-CM | POA: Diagnosis not present

## 2014-10-31 DIAGNOSIS — H9201 Otalgia, right ear: Secondary | ICD-10-CM | POA: Diagnosis not present

## 2014-10-31 DIAGNOSIS — M25562 Pain in left knee: Secondary | ICD-10-CM | POA: Diagnosis not present

## 2014-10-31 DIAGNOSIS — R42 Dizziness and giddiness: Secondary | ICD-10-CM | POA: Diagnosis not present

## 2014-11-01 DIAGNOSIS — M25569 Pain in unspecified knee: Secondary | ICD-10-CM | POA: Diagnosis not present

## 2014-11-01 DIAGNOSIS — I7090 Unspecified atherosclerosis: Secondary | ICD-10-CM | POA: Diagnosis not present

## 2014-11-01 DIAGNOSIS — R42 Dizziness and giddiness: Secondary | ICD-10-CM | POA: Diagnosis not present

## 2014-11-01 DIAGNOSIS — G609 Hereditary and idiopathic neuropathy, unspecified: Secondary | ICD-10-CM | POA: Diagnosis not present

## 2014-11-02 DIAGNOSIS — G619 Inflammatory polyneuropathy, unspecified: Secondary | ICD-10-CM | POA: Diagnosis not present

## 2014-11-04 DIAGNOSIS — F324 Major depressive disorder, single episode, in partial remission: Secondary | ICD-10-CM | POA: Diagnosis not present

## 2014-11-04 DIAGNOSIS — I10 Essential (primary) hypertension: Secondary | ICD-10-CM | POA: Diagnosis not present

## 2014-11-04 DIAGNOSIS — R269 Unspecified abnormalities of gait and mobility: Secondary | ICD-10-CM | POA: Diagnosis not present

## 2014-11-04 DIAGNOSIS — M25561 Pain in right knee: Secondary | ICD-10-CM | POA: Diagnosis not present

## 2014-11-04 DIAGNOSIS — R42 Dizziness and giddiness: Secondary | ICD-10-CM | POA: Diagnosis not present

## 2014-11-04 DIAGNOSIS — R531 Weakness: Secondary | ICD-10-CM | POA: Diagnosis not present

## 2014-11-09 DIAGNOSIS — G619 Inflammatory polyneuropathy, unspecified: Secondary | ICD-10-CM | POA: Diagnosis not present

## 2014-11-14 DIAGNOSIS — G619 Inflammatory polyneuropathy, unspecified: Secondary | ICD-10-CM | POA: Diagnosis not present

## 2014-11-15 DIAGNOSIS — G619 Inflammatory polyneuropathy, unspecified: Secondary | ICD-10-CM | POA: Diagnosis not present

## 2014-11-20 DIAGNOSIS — H9201 Otalgia, right ear: Secondary | ICD-10-CM | POA: Diagnosis not present

## 2014-11-20 DIAGNOSIS — H6591 Unspecified nonsuppurative otitis media, right ear: Secondary | ICD-10-CM | POA: Diagnosis not present

## 2014-11-20 DIAGNOSIS — J069 Acute upper respiratory infection, unspecified: Secondary | ICD-10-CM | POA: Diagnosis not present

## 2014-11-22 DIAGNOSIS — R279 Unspecified lack of coordination: Secondary | ICD-10-CM | POA: Diagnosis not present

## 2014-11-22 DIAGNOSIS — M6281 Muscle weakness (generalized): Secondary | ICD-10-CM | POA: Diagnosis not present

## 2014-11-22 DIAGNOSIS — I10 Essential (primary) hypertension: Secondary | ICD-10-CM | POA: Diagnosis not present

## 2014-11-22 DIAGNOSIS — G609 Hereditary and idiopathic neuropathy, unspecified: Secondary | ICD-10-CM | POA: Diagnosis not present

## 2014-11-23 DIAGNOSIS — G609 Hereditary and idiopathic neuropathy, unspecified: Secondary | ICD-10-CM | POA: Diagnosis not present

## 2014-11-23 DIAGNOSIS — I10 Essential (primary) hypertension: Secondary | ICD-10-CM | POA: Diagnosis not present

## 2014-11-24 DIAGNOSIS — I10 Essential (primary) hypertension: Secondary | ICD-10-CM | POA: Diagnosis not present

## 2014-11-24 DIAGNOSIS — G609 Hereditary and idiopathic neuropathy, unspecified: Secondary | ICD-10-CM | POA: Diagnosis not present

## 2014-11-28 DIAGNOSIS — I10 Essential (primary) hypertension: Secondary | ICD-10-CM | POA: Diagnosis not present

## 2014-11-28 DIAGNOSIS — G609 Hereditary and idiopathic neuropathy, unspecified: Secondary | ICD-10-CM | POA: Diagnosis not present

## 2014-11-29 DIAGNOSIS — I10 Essential (primary) hypertension: Secondary | ICD-10-CM | POA: Diagnosis not present

## 2014-11-29 DIAGNOSIS — G609 Hereditary and idiopathic neuropathy, unspecified: Secondary | ICD-10-CM | POA: Diagnosis not present

## 2014-11-30 DIAGNOSIS — I10 Essential (primary) hypertension: Secondary | ICD-10-CM | POA: Diagnosis not present

## 2014-11-30 DIAGNOSIS — G609 Hereditary and idiopathic neuropathy, unspecified: Secondary | ICD-10-CM | POA: Diagnosis not present

## 2014-12-01 DIAGNOSIS — G609 Hereditary and idiopathic neuropathy, unspecified: Secondary | ICD-10-CM | POA: Diagnosis not present

## 2014-12-01 DIAGNOSIS — I10 Essential (primary) hypertension: Secondary | ICD-10-CM | POA: Diagnosis not present

## 2014-12-05 DIAGNOSIS — G609 Hereditary and idiopathic neuropathy, unspecified: Secondary | ICD-10-CM | POA: Diagnosis not present

## 2014-12-05 DIAGNOSIS — I10 Essential (primary) hypertension: Secondary | ICD-10-CM | POA: Diagnosis not present

## 2014-12-06 DIAGNOSIS — I10 Essential (primary) hypertension: Secondary | ICD-10-CM | POA: Diagnosis not present

## 2014-12-06 DIAGNOSIS — G609 Hereditary and idiopathic neuropathy, unspecified: Secondary | ICD-10-CM | POA: Diagnosis not present

## 2014-12-07 DIAGNOSIS — E46 Unspecified protein-calorie malnutrition: Secondary | ICD-10-CM | POA: Diagnosis not present

## 2014-12-07 DIAGNOSIS — I130 Hypertensive heart and chronic kidney disease with heart failure and stage 1 through stage 4 chronic kidney disease, or unspecified chronic kidney disease: Secondary | ICD-10-CM | POA: Diagnosis not present

## 2014-12-07 DIAGNOSIS — N183 Chronic kidney disease, stage 3 (moderate): Secondary | ICD-10-CM | POA: Diagnosis not present

## 2014-12-07 DIAGNOSIS — J301 Allergic rhinitis due to pollen: Secondary | ICD-10-CM | POA: Diagnosis not present

## 2014-12-07 DIAGNOSIS — K5900 Constipation, unspecified: Secondary | ICD-10-CM | POA: Diagnosis not present

## 2014-12-07 DIAGNOSIS — G629 Polyneuropathy, unspecified: Secondary | ICD-10-CM | POA: Diagnosis not present

## 2014-12-07 DIAGNOSIS — I5030 Unspecified diastolic (congestive) heart failure: Secondary | ICD-10-CM | POA: Diagnosis not present

## 2014-12-07 DIAGNOSIS — M1990 Unspecified osteoarthritis, unspecified site: Secondary | ICD-10-CM | POA: Diagnosis not present

## 2014-12-07 DIAGNOSIS — R609 Edema, unspecified: Secondary | ICD-10-CM | POA: Diagnosis not present

## 2014-12-07 DIAGNOSIS — H6990 Unspecified Eustachian tube disorder, unspecified ear: Secondary | ICD-10-CM | POA: Diagnosis not present

## 2014-12-07 DIAGNOSIS — G609 Hereditary and idiopathic neuropathy, unspecified: Secondary | ICD-10-CM | POA: Diagnosis not present

## 2014-12-07 DIAGNOSIS — I10 Essential (primary) hypertension: Secondary | ICD-10-CM | POA: Diagnosis not present

## 2014-12-08 DIAGNOSIS — G609 Hereditary and idiopathic neuropathy, unspecified: Secondary | ICD-10-CM | POA: Diagnosis not present

## 2014-12-08 DIAGNOSIS — I10 Essential (primary) hypertension: Secondary | ICD-10-CM | POA: Diagnosis not present

## 2014-12-09 DIAGNOSIS — G609 Hereditary and idiopathic neuropathy, unspecified: Secondary | ICD-10-CM | POA: Diagnosis not present

## 2014-12-09 DIAGNOSIS — I10 Essential (primary) hypertension: Secondary | ICD-10-CM | POA: Diagnosis not present

## 2014-12-12 DIAGNOSIS — H6122 Impacted cerumen, left ear: Secondary | ICD-10-CM | POA: Diagnosis not present

## 2014-12-12 DIAGNOSIS — H6983 Other specified disorders of Eustachian tube, bilateral: Secondary | ICD-10-CM | POA: Diagnosis not present

## 2014-12-12 DIAGNOSIS — H9193 Unspecified hearing loss, bilateral: Secondary | ICD-10-CM | POA: Diagnosis not present

## 2014-12-13 DIAGNOSIS — G609 Hereditary and idiopathic neuropathy, unspecified: Secondary | ICD-10-CM | POA: Diagnosis not present

## 2014-12-13 DIAGNOSIS — I10 Essential (primary) hypertension: Secondary | ICD-10-CM | POA: Diagnosis not present

## 2014-12-14 DIAGNOSIS — I10 Essential (primary) hypertension: Secondary | ICD-10-CM | POA: Diagnosis not present

## 2014-12-14 DIAGNOSIS — G609 Hereditary and idiopathic neuropathy, unspecified: Secondary | ICD-10-CM | POA: Diagnosis not present

## 2014-12-15 DIAGNOSIS — G609 Hereditary and idiopathic neuropathy, unspecified: Secondary | ICD-10-CM | POA: Diagnosis not present

## 2014-12-15 DIAGNOSIS — I10 Essential (primary) hypertension: Secondary | ICD-10-CM | POA: Diagnosis not present

## 2014-12-17 DIAGNOSIS — I10 Essential (primary) hypertension: Secondary | ICD-10-CM | POA: Diagnosis not present

## 2014-12-17 DIAGNOSIS — G609 Hereditary and idiopathic neuropathy, unspecified: Secondary | ICD-10-CM | POA: Diagnosis not present

## 2014-12-19 DIAGNOSIS — G609 Hereditary and idiopathic neuropathy, unspecified: Secondary | ICD-10-CM | POA: Diagnosis not present

## 2014-12-19 DIAGNOSIS — I10 Essential (primary) hypertension: Secondary | ICD-10-CM | POA: Diagnosis not present

## 2014-12-20 DIAGNOSIS — G609 Hereditary and idiopathic neuropathy, unspecified: Secondary | ICD-10-CM | POA: Diagnosis not present

## 2014-12-20 DIAGNOSIS — I10 Essential (primary) hypertension: Secondary | ICD-10-CM | POA: Diagnosis not present

## 2014-12-21 DIAGNOSIS — I10 Essential (primary) hypertension: Secondary | ICD-10-CM | POA: Diagnosis not present

## 2014-12-21 DIAGNOSIS — G609 Hereditary and idiopathic neuropathy, unspecified: Secondary | ICD-10-CM | POA: Diagnosis not present

## 2014-12-22 DIAGNOSIS — I10 Essential (primary) hypertension: Secondary | ICD-10-CM | POA: Diagnosis not present

## 2014-12-22 DIAGNOSIS — G609 Hereditary and idiopathic neuropathy, unspecified: Secondary | ICD-10-CM | POA: Diagnosis not present

## 2014-12-27 DIAGNOSIS — G609 Hereditary and idiopathic neuropathy, unspecified: Secondary | ICD-10-CM | POA: Diagnosis not present

## 2014-12-27 DIAGNOSIS — I10 Essential (primary) hypertension: Secondary | ICD-10-CM | POA: Diagnosis not present

## 2014-12-28 DIAGNOSIS — I130 Hypertensive heart and chronic kidney disease with heart failure and stage 1 through stage 4 chronic kidney disease, or unspecified chronic kidney disease: Secondary | ICD-10-CM | POA: Diagnosis not present

## 2014-12-28 DIAGNOSIS — I5030 Unspecified diastolic (congestive) heart failure: Secondary | ICD-10-CM | POA: Diagnosis not present

## 2014-12-28 DIAGNOSIS — R609 Edema, unspecified: Secondary | ICD-10-CM | POA: Diagnosis not present

## 2014-12-28 DIAGNOSIS — N183 Chronic kidney disease, stage 3 (moderate): Secondary | ICD-10-CM | POA: Diagnosis not present

## 2014-12-28 DIAGNOSIS — H6990 Unspecified Eustachian tube disorder, unspecified ear: Secondary | ICD-10-CM | POA: Diagnosis not present

## 2014-12-28 DIAGNOSIS — E46 Unspecified protein-calorie malnutrition: Secondary | ICD-10-CM | POA: Diagnosis not present

## 2014-12-28 DIAGNOSIS — G629 Polyneuropathy, unspecified: Secondary | ICD-10-CM | POA: Diagnosis not present

## 2014-12-28 DIAGNOSIS — M1990 Unspecified osteoarthritis, unspecified site: Secondary | ICD-10-CM | POA: Diagnosis not present

## 2014-12-28 DIAGNOSIS — K5900 Constipation, unspecified: Secondary | ICD-10-CM | POA: Diagnosis not present

## 2014-12-30 DIAGNOSIS — G609 Hereditary and idiopathic neuropathy, unspecified: Secondary | ICD-10-CM | POA: Diagnosis not present

## 2014-12-30 DIAGNOSIS — I10 Essential (primary) hypertension: Secondary | ICD-10-CM | POA: Diagnosis not present

## 2015-01-10 DIAGNOSIS — H6122 Impacted cerumen, left ear: Secondary | ICD-10-CM | POA: Diagnosis not present

## 2015-01-10 DIAGNOSIS — L299 Pruritus, unspecified: Secondary | ICD-10-CM | POA: Diagnosis not present

## 2015-01-17 ENCOUNTER — Ambulatory Visit: Payer: Medicare Other | Admitting: Neurology

## 2015-01-21 DIAGNOSIS — K210 Gastro-esophageal reflux disease with esophagitis: Secondary | ICD-10-CM | POA: Diagnosis not present

## 2015-03-08 DIAGNOSIS — K5900 Constipation, unspecified: Secondary | ICD-10-CM | POA: Diagnosis not present

## 2015-03-08 DIAGNOSIS — K219 Gastro-esophageal reflux disease without esophagitis: Secondary | ICD-10-CM | POA: Diagnosis not present

## 2015-03-08 DIAGNOSIS — G629 Polyneuropathy, unspecified: Secondary | ICD-10-CM | POA: Diagnosis not present

## 2015-03-08 DIAGNOSIS — N183 Chronic kidney disease, stage 3 (moderate): Secondary | ICD-10-CM | POA: Diagnosis not present

## 2015-03-08 DIAGNOSIS — I130 Hypertensive heart and chronic kidney disease with heart failure and stage 1 through stage 4 chronic kidney disease, or unspecified chronic kidney disease: Secondary | ICD-10-CM | POA: Diagnosis not present

## 2015-03-08 DIAGNOSIS — E782 Mixed hyperlipidemia: Secondary | ICD-10-CM | POA: Diagnosis not present

## 2015-03-16 ENCOUNTER — Ambulatory Visit: Payer: Medicare Other

## 2015-03-22 DIAGNOSIS — N183 Chronic kidney disease, stage 3 (moderate): Secondary | ICD-10-CM | POA: Diagnosis not present

## 2015-03-23 ENCOUNTER — Ambulatory Visit: Payer: Medicare Other

## 2015-03-23 DIAGNOSIS — B351 Tinea unguium: Secondary | ICD-10-CM

## 2015-03-23 DIAGNOSIS — M79673 Pain in unspecified foot: Secondary | ICD-10-CM

## 2015-06-08 NOTE — Progress Notes (Signed)
Subjective:     Patient ID: Alan Ewing, male   DOB: [birth year], 81 y.o.   MRN: [id number]  HPI patient presents with thick nailbeds 1-5 both feet that are painful when pressed   Review of Systems     Objective:   Physical Exam Neurovascular status intact with thick yellow brittle nailbeds 1-5 both feet    Assessment:     Mycotic nail infection is with pain 1-5 both feet    Plan:     Debris painful nailbeds 1-5 both feet with no iatrogenic bleeding noted

## 2015-06-29 ENCOUNTER — Encounter: Payer: Self-pay | Admitting: Podiatry

## 2015-06-29 ENCOUNTER — Ambulatory Visit (INDEPENDENT_AMBULATORY_CARE_PROVIDER_SITE_OTHER): Payer: Medicare Other | Admitting: Podiatry

## 2015-06-29 DIAGNOSIS — B351 Tinea unguium: Secondary | ICD-10-CM | POA: Diagnosis not present

## 2015-06-29 DIAGNOSIS — G609 Hereditary and idiopathic neuropathy, unspecified: Secondary | ICD-10-CM

## 2015-06-29 DIAGNOSIS — M79673 Pain in unspecified foot: Secondary | ICD-10-CM | POA: Diagnosis not present

## 2015-06-29 NOTE — Progress Notes (Signed)
Patient ID: Alan Ewing, male   DOB: Mar 11, 1925, 81 y.o.   MRN: [id number] Complaint:  Visit Type: Patient returns to my office for continued preventative foot care services. Complaint: Patient states" my nails have grown long and thick and become painful to walk and wear shoes" Patient has been diagnosed with non diabetic neuropathy.. The patient presents for preventative foot care services. No changes to ROS  Podiatric Exam: Vascular: dorsalis pedis and posterior tibial pulses are palpable bilateral. Capillary return is immediate. Temperature gradient is diminished.. Skin turgor WNL  Sensorium: Normal Semmes Weinstein monofilament test. Normal tactile sensation bilaterally. Nail Exam: Pt has thick disfigured discolored nails with subungual debris noted bilateral entire nail hallux through fifth toenails Ulcer Exam: There is no evidence of ulcer or pre-ulcerative changes or infection. Orthopedic Exam: Muscle tone and strength are WNL. No limitations in general ROM. No crepitus or effusions noted. Foot type and digits show no abnormalities. Bony prominences are unremarkable. Skin: No Porokeratosis. No infection or ulcers  Diagnosis:  Onychomycosis, , Pain in right toe, pain in left toes  Treatment & Plan Procedures and Treatment: Consent by patient was obtained for treatment procedures. The patient understood the discussion of treatment and procedures well. All questions were answered thoroughly reviewed. Debridement of mycotic and hypertrophic toenails, 1 through 5 bilateral and clearing of subungual debris. No ulceration, no infection noted.  Return Visit-Office Procedure: Patient instructed to return to the office for a follow up visit 3 months for continued evaluation and treatment.

## 2015-07-26 DIAGNOSIS — M1990 Unspecified osteoarthritis, unspecified site: Secondary | ICD-10-CM | POA: Diagnosis not present

## 2015-07-26 DIAGNOSIS — E782 Mixed hyperlipidemia: Secondary | ICD-10-CM | POA: Diagnosis not present

## 2015-07-26 DIAGNOSIS — I130 Hypertensive heart and chronic kidney disease with heart failure and stage 1 through stage 4 chronic kidney disease, or unspecified chronic kidney disease: Secondary | ICD-10-CM | POA: Diagnosis not present

## 2015-07-26 DIAGNOSIS — G629 Polyneuropathy, unspecified: Secondary | ICD-10-CM | POA: Diagnosis not present

## 2015-07-26 DIAGNOSIS — I5030 Unspecified diastolic (congestive) heart failure: Secondary | ICD-10-CM | POA: Diagnosis not present

## 2015-07-26 DIAGNOSIS — K5900 Constipation, unspecified: Secondary | ICD-10-CM | POA: Diagnosis not present

## 2015-07-26 DIAGNOSIS — Z23 Encounter for immunization: Secondary | ICD-10-CM | POA: Diagnosis not present

## 2015-07-26 DIAGNOSIS — N183 Chronic kidney disease, stage 3 (moderate): Secondary | ICD-10-CM | POA: Diagnosis not present

## 2015-07-26 DIAGNOSIS — L89152 Pressure ulcer of sacral region, stage 2: Secondary | ICD-10-CM | POA: Diagnosis not present

## 2015-07-26 DIAGNOSIS — D649 Anemia, unspecified: Secondary | ICD-10-CM | POA: Diagnosis not present

## 2015-07-26 DIAGNOSIS — Z0000 Encounter for general adult medical examination without abnormal findings: Secondary | ICD-10-CM | POA: Diagnosis not present

## 2015-07-26 DIAGNOSIS — E46 Unspecified protein-calorie malnutrition: Secondary | ICD-10-CM | POA: Diagnosis not present

## 2015-07-29 DIAGNOSIS — I5030 Unspecified diastolic (congestive) heart failure: Secondary | ICD-10-CM | POA: Diagnosis not present

## 2015-07-29 DIAGNOSIS — I130 Hypertensive heart and chronic kidney disease with heart failure and stage 1 through stage 4 chronic kidney disease, or unspecified chronic kidney disease: Secondary | ICD-10-CM | POA: Diagnosis not present

## 2015-07-29 DIAGNOSIS — G629 Polyneuropathy, unspecified: Secondary | ICD-10-CM | POA: Diagnosis not present

## 2015-07-29 DIAGNOSIS — L89322 Pressure ulcer of left buttock, stage 2: Secondary | ICD-10-CM | POA: Diagnosis not present

## 2015-07-29 DIAGNOSIS — F329 Major depressive disorder, single episode, unspecified: Secondary | ICD-10-CM | POA: Diagnosis not present

## 2015-07-29 DIAGNOSIS — N183 Chronic kidney disease, stage 3 (moderate): Secondary | ICD-10-CM | POA: Diagnosis not present

## 2015-08-02 DIAGNOSIS — G629 Polyneuropathy, unspecified: Secondary | ICD-10-CM | POA: Diagnosis not present

## 2015-08-02 DIAGNOSIS — I5030 Unspecified diastolic (congestive) heart failure: Secondary | ICD-10-CM | POA: Diagnosis not present

## 2015-08-02 DIAGNOSIS — L89322 Pressure ulcer of left buttock, stage 2: Secondary | ICD-10-CM | POA: Diagnosis not present

## 2015-08-02 DIAGNOSIS — F329 Major depressive disorder, single episode, unspecified: Secondary | ICD-10-CM | POA: Diagnosis not present

## 2015-08-02 DIAGNOSIS — I130 Hypertensive heart and chronic kidney disease with heart failure and stage 1 through stage 4 chronic kidney disease, or unspecified chronic kidney disease: Secondary | ICD-10-CM | POA: Diagnosis not present

## 2015-08-02 DIAGNOSIS — N183 Chronic kidney disease, stage 3 (moderate): Secondary | ICD-10-CM | POA: Diagnosis not present

## 2015-08-04 DIAGNOSIS — N183 Chronic kidney disease, stage 3 (moderate): Secondary | ICD-10-CM | POA: Diagnosis not present

## 2015-08-04 DIAGNOSIS — G629 Polyneuropathy, unspecified: Secondary | ICD-10-CM | POA: Diagnosis not present

## 2015-08-04 DIAGNOSIS — I5030 Unspecified diastolic (congestive) heart failure: Secondary | ICD-10-CM | POA: Diagnosis not present

## 2015-08-04 DIAGNOSIS — L89322 Pressure ulcer of left buttock, stage 2: Secondary | ICD-10-CM | POA: Diagnosis not present

## 2015-08-04 DIAGNOSIS — F329 Major depressive disorder, single episode, unspecified: Secondary | ICD-10-CM | POA: Diagnosis not present

## 2015-08-04 DIAGNOSIS — I130 Hypertensive heart and chronic kidney disease with heart failure and stage 1 through stage 4 chronic kidney disease, or unspecified chronic kidney disease: Secondary | ICD-10-CM | POA: Diagnosis not present

## 2015-08-08 DIAGNOSIS — I130 Hypertensive heart and chronic kidney disease with heart failure and stage 1 through stage 4 chronic kidney disease, or unspecified chronic kidney disease: Secondary | ICD-10-CM | POA: Diagnosis not present

## 2015-08-08 DIAGNOSIS — D649 Anemia, unspecified: Secondary | ICD-10-CM | POA: Diagnosis not present

## 2015-08-08 DIAGNOSIS — N179 Acute kidney failure, unspecified: Secondary | ICD-10-CM | POA: Diagnosis not present

## 2015-08-09 DIAGNOSIS — G629 Polyneuropathy, unspecified: Secondary | ICD-10-CM | POA: Diagnosis not present

## 2015-08-09 DIAGNOSIS — N183 Chronic kidney disease, stage 3 (moderate): Secondary | ICD-10-CM | POA: Diagnosis not present

## 2015-08-09 DIAGNOSIS — I130 Hypertensive heart and chronic kidney disease with heart failure and stage 1 through stage 4 chronic kidney disease, or unspecified chronic kidney disease: Secondary | ICD-10-CM | POA: Diagnosis not present

## 2015-08-09 DIAGNOSIS — L89322 Pressure ulcer of left buttock, stage 2: Secondary | ICD-10-CM | POA: Diagnosis not present

## 2015-08-09 DIAGNOSIS — F329 Major depressive disorder, single episode, unspecified: Secondary | ICD-10-CM | POA: Diagnosis not present

## 2015-08-09 DIAGNOSIS — I5030 Unspecified diastolic (congestive) heart failure: Secondary | ICD-10-CM | POA: Diagnosis not present

## 2015-08-11 DIAGNOSIS — F329 Major depressive disorder, single episode, unspecified: Secondary | ICD-10-CM | POA: Diagnosis not present

## 2015-08-11 DIAGNOSIS — N183 Chronic kidney disease, stage 3 (moderate): Secondary | ICD-10-CM | POA: Diagnosis not present

## 2015-08-11 DIAGNOSIS — G629 Polyneuropathy, unspecified: Secondary | ICD-10-CM | POA: Diagnosis not present

## 2015-08-11 DIAGNOSIS — I130 Hypertensive heart and chronic kidney disease with heart failure and stage 1 through stage 4 chronic kidney disease, or unspecified chronic kidney disease: Secondary | ICD-10-CM | POA: Diagnosis not present

## 2015-08-11 DIAGNOSIS — L89322 Pressure ulcer of left buttock, stage 2: Secondary | ICD-10-CM | POA: Diagnosis not present

## 2015-08-11 DIAGNOSIS — I5030 Unspecified diastolic (congestive) heart failure: Secondary | ICD-10-CM | POA: Diagnosis not present

## 2015-08-14 DIAGNOSIS — I130 Hypertensive heart and chronic kidney disease with heart failure and stage 1 through stage 4 chronic kidney disease, or unspecified chronic kidney disease: Secondary | ICD-10-CM | POA: Diagnosis not present

## 2015-08-14 DIAGNOSIS — I5030 Unspecified diastolic (congestive) heart failure: Secondary | ICD-10-CM | POA: Diagnosis not present

## 2015-08-14 DIAGNOSIS — G629 Polyneuropathy, unspecified: Secondary | ICD-10-CM | POA: Diagnosis not present

## 2015-08-14 DIAGNOSIS — N183 Chronic kidney disease, stage 3 (moderate): Secondary | ICD-10-CM | POA: Diagnosis not present

## 2015-08-14 DIAGNOSIS — L89322 Pressure ulcer of left buttock, stage 2: Secondary | ICD-10-CM | POA: Diagnosis not present

## 2015-08-14 DIAGNOSIS — F329 Major depressive disorder, single episode, unspecified: Secondary | ICD-10-CM | POA: Diagnosis not present

## 2015-08-17 DIAGNOSIS — N183 Chronic kidney disease, stage 3 (moderate): Secondary | ICD-10-CM | POA: Diagnosis not present

## 2015-08-17 DIAGNOSIS — I130 Hypertensive heart and chronic kidney disease with heart failure and stage 1 through stage 4 chronic kidney disease, or unspecified chronic kidney disease: Secondary | ICD-10-CM | POA: Diagnosis not present

## 2015-08-17 DIAGNOSIS — G629 Polyneuropathy, unspecified: Secondary | ICD-10-CM | POA: Diagnosis not present

## 2015-08-17 DIAGNOSIS — L89322 Pressure ulcer of left buttock, stage 2: Secondary | ICD-10-CM | POA: Diagnosis not present

## 2015-08-17 DIAGNOSIS — I5030 Unspecified diastolic (congestive) heart failure: Secondary | ICD-10-CM | POA: Diagnosis not present

## 2015-08-17 DIAGNOSIS — F329 Major depressive disorder, single episode, unspecified: Secondary | ICD-10-CM | POA: Diagnosis not present

## 2015-08-21 DIAGNOSIS — I5030 Unspecified diastolic (congestive) heart failure: Secondary | ICD-10-CM | POA: Diagnosis not present

## 2015-08-21 DIAGNOSIS — F329 Major depressive disorder, single episode, unspecified: Secondary | ICD-10-CM | POA: Diagnosis not present

## 2015-08-21 DIAGNOSIS — N183 Chronic kidney disease, stage 3 (moderate): Secondary | ICD-10-CM | POA: Diagnosis not present

## 2015-08-21 DIAGNOSIS — G629 Polyneuropathy, unspecified: Secondary | ICD-10-CM | POA: Diagnosis not present

## 2015-08-21 DIAGNOSIS — I130 Hypertensive heart and chronic kidney disease with heart failure and stage 1 through stage 4 chronic kidney disease, or unspecified chronic kidney disease: Secondary | ICD-10-CM | POA: Diagnosis not present

## 2015-08-21 DIAGNOSIS — L89322 Pressure ulcer of left buttock, stage 2: Secondary | ICD-10-CM | POA: Diagnosis not present

## 2015-08-22 DIAGNOSIS — N179 Acute kidney failure, unspecified: Secondary | ICD-10-CM | POA: Diagnosis not present

## 2015-08-24 DIAGNOSIS — I130 Hypertensive heart and chronic kidney disease with heart failure and stage 1 through stage 4 chronic kidney disease, or unspecified chronic kidney disease: Secondary | ICD-10-CM | POA: Diagnosis not present

## 2015-08-24 DIAGNOSIS — L89322 Pressure ulcer of left buttock, stage 2: Secondary | ICD-10-CM | POA: Diagnosis not present

## 2015-08-24 DIAGNOSIS — G629 Polyneuropathy, unspecified: Secondary | ICD-10-CM | POA: Diagnosis not present

## 2015-08-24 DIAGNOSIS — N183 Chronic kidney disease, stage 3 (moderate): Secondary | ICD-10-CM | POA: Diagnosis not present

## 2015-08-24 DIAGNOSIS — F329 Major depressive disorder, single episode, unspecified: Secondary | ICD-10-CM | POA: Diagnosis not present

## 2015-08-24 DIAGNOSIS — I5030 Unspecified diastolic (congestive) heart failure: Secondary | ICD-10-CM | POA: Diagnosis not present

## 2015-08-29 DIAGNOSIS — F329 Major depressive disorder, single episode, unspecified: Secondary | ICD-10-CM | POA: Diagnosis not present

## 2015-08-29 DIAGNOSIS — G629 Polyneuropathy, unspecified: Secondary | ICD-10-CM | POA: Diagnosis not present

## 2015-08-29 DIAGNOSIS — N183 Chronic kidney disease, stage 3 (moderate): Secondary | ICD-10-CM | POA: Diagnosis not present

## 2015-08-29 DIAGNOSIS — L89322 Pressure ulcer of left buttock, stage 2: Secondary | ICD-10-CM | POA: Diagnosis not present

## 2015-08-29 DIAGNOSIS — I130 Hypertensive heart and chronic kidney disease with heart failure and stage 1 through stage 4 chronic kidney disease, or unspecified chronic kidney disease: Secondary | ICD-10-CM | POA: Diagnosis not present

## 2015-08-29 DIAGNOSIS — I5030 Unspecified diastolic (congestive) heart failure: Secondary | ICD-10-CM | POA: Diagnosis not present

## 2015-09-05 DIAGNOSIS — G629 Polyneuropathy, unspecified: Secondary | ICD-10-CM | POA: Diagnosis not present

## 2015-09-05 DIAGNOSIS — N183 Chronic kidney disease, stage 3 (moderate): Secondary | ICD-10-CM | POA: Diagnosis not present

## 2015-09-05 DIAGNOSIS — F329 Major depressive disorder, single episode, unspecified: Secondary | ICD-10-CM | POA: Diagnosis not present

## 2015-09-05 DIAGNOSIS — I5030 Unspecified diastolic (congestive) heart failure: Secondary | ICD-10-CM | POA: Diagnosis not present

## 2015-09-05 DIAGNOSIS — L89322 Pressure ulcer of left buttock, stage 2: Secondary | ICD-10-CM | POA: Diagnosis not present

## 2015-09-05 DIAGNOSIS — I130 Hypertensive heart and chronic kidney disease with heart failure and stage 1 through stage 4 chronic kidney disease, or unspecified chronic kidney disease: Secondary | ICD-10-CM | POA: Diagnosis not present

## 2015-09-06 DIAGNOSIS — N179 Acute kidney failure, unspecified: Secondary | ICD-10-CM | POA: Diagnosis not present

## 2015-09-14 DIAGNOSIS — F329 Major depressive disorder, single episode, unspecified: Secondary | ICD-10-CM | POA: Diagnosis not present

## 2015-09-14 DIAGNOSIS — N183 Chronic kidney disease, stage 3 (moderate): Secondary | ICD-10-CM | POA: Diagnosis not present

## 2015-09-14 DIAGNOSIS — I5030 Unspecified diastolic (congestive) heart failure: Secondary | ICD-10-CM | POA: Diagnosis not present

## 2015-09-14 DIAGNOSIS — L89322 Pressure ulcer of left buttock, stage 2: Secondary | ICD-10-CM | POA: Diagnosis not present

## 2015-09-14 DIAGNOSIS — G629 Polyneuropathy, unspecified: Secondary | ICD-10-CM | POA: Diagnosis not present

## 2015-09-14 DIAGNOSIS — I130 Hypertensive heart and chronic kidney disease with heart failure and stage 1 through stage 4 chronic kidney disease, or unspecified chronic kidney disease: Secondary | ICD-10-CM | POA: Diagnosis not present

## 2015-09-19 DIAGNOSIS — G629 Polyneuropathy, unspecified: Secondary | ICD-10-CM | POA: Diagnosis not present

## 2015-09-19 DIAGNOSIS — L89322 Pressure ulcer of left buttock, stage 2: Secondary | ICD-10-CM | POA: Diagnosis not present

## 2015-09-19 DIAGNOSIS — I130 Hypertensive heart and chronic kidney disease with heart failure and stage 1 through stage 4 chronic kidney disease, or unspecified chronic kidney disease: Secondary | ICD-10-CM | POA: Diagnosis not present

## 2015-09-19 DIAGNOSIS — F329 Major depressive disorder, single episode, unspecified: Secondary | ICD-10-CM | POA: Diagnosis not present

## 2015-09-19 DIAGNOSIS — N183 Chronic kidney disease, stage 3 (moderate): Secondary | ICD-10-CM | POA: Diagnosis not present

## 2015-09-19 DIAGNOSIS — I5030 Unspecified diastolic (congestive) heart failure: Secondary | ICD-10-CM | POA: Diagnosis not present

## 2015-09-21 DIAGNOSIS — L89322 Pressure ulcer of left buttock, stage 2: Secondary | ICD-10-CM | POA: Diagnosis not present

## 2015-09-21 DIAGNOSIS — I130 Hypertensive heart and chronic kidney disease with heart failure and stage 1 through stage 4 chronic kidney disease, or unspecified chronic kidney disease: Secondary | ICD-10-CM | POA: Diagnosis not present

## 2015-09-21 DIAGNOSIS — N183 Chronic kidney disease, stage 3 (moderate): Secondary | ICD-10-CM | POA: Diagnosis not present

## 2015-09-21 DIAGNOSIS — F329 Major depressive disorder, single episode, unspecified: Secondary | ICD-10-CM | POA: Diagnosis not present

## 2015-09-21 DIAGNOSIS — G629 Polyneuropathy, unspecified: Secondary | ICD-10-CM | POA: Diagnosis not present

## 2015-09-21 DIAGNOSIS — I5030 Unspecified diastolic (congestive) heart failure: Secondary | ICD-10-CM | POA: Diagnosis not present

## 2015-09-27 DIAGNOSIS — N183 Chronic kidney disease, stage 3 (moderate): Secondary | ICD-10-CM | POA: Diagnosis not present

## 2015-09-27 DIAGNOSIS — I5030 Unspecified diastolic (congestive) heart failure: Secondary | ICD-10-CM | POA: Diagnosis not present

## 2015-09-27 DIAGNOSIS — L89322 Pressure ulcer of left buttock, stage 2: Secondary | ICD-10-CM | POA: Diagnosis not present

## 2015-09-27 DIAGNOSIS — I130 Hypertensive heart and chronic kidney disease with heart failure and stage 1 through stage 4 chronic kidney disease, or unspecified chronic kidney disease: Secondary | ICD-10-CM | POA: Diagnosis not present

## 2015-09-27 DIAGNOSIS — F329 Major depressive disorder, single episode, unspecified: Secondary | ICD-10-CM | POA: Diagnosis not present

## 2015-09-27 DIAGNOSIS — G629 Polyneuropathy, unspecified: Secondary | ICD-10-CM | POA: Diagnosis not present

## 2015-09-28 ENCOUNTER — Ambulatory Visit: Payer: Medicare Other | Admitting: Podiatry

## 2015-09-29 DIAGNOSIS — G629 Polyneuropathy, unspecified: Secondary | ICD-10-CM | POA: Diagnosis not present

## 2015-09-29 DIAGNOSIS — L89322 Pressure ulcer of left buttock, stage 2: Secondary | ICD-10-CM | POA: Diagnosis not present

## 2015-09-29 DIAGNOSIS — F329 Major depressive disorder, single episode, unspecified: Secondary | ICD-10-CM | POA: Diagnosis not present

## 2015-09-29 DIAGNOSIS — N183 Chronic kidney disease, stage 3 (moderate): Secondary | ICD-10-CM | POA: Diagnosis not present

## 2015-09-29 DIAGNOSIS — I5030 Unspecified diastolic (congestive) heart failure: Secondary | ICD-10-CM | POA: Diagnosis not present

## 2015-09-29 DIAGNOSIS — R0989 Other specified symptoms and signs involving the circulatory and respiratory systems: Secondary | ICD-10-CM | POA: Diagnosis not present

## 2015-09-29 DIAGNOSIS — I130 Hypertensive heart and chronic kidney disease with heart failure and stage 1 through stage 4 chronic kidney disease, or unspecified chronic kidney disease: Secondary | ICD-10-CM | POA: Diagnosis not present

## 2015-10-03 ENCOUNTER — Encounter: Payer: Self-pay | Admitting: Podiatry

## 2015-10-03 ENCOUNTER — Ambulatory Visit (INDEPENDENT_AMBULATORY_CARE_PROVIDER_SITE_OTHER): Payer: Medicare Other | Admitting: Podiatry

## 2015-10-03 DIAGNOSIS — F329 Major depressive disorder, single episode, unspecified: Secondary | ICD-10-CM | POA: Diagnosis not present

## 2015-10-03 DIAGNOSIS — B351 Tinea unguium: Secondary | ICD-10-CM

## 2015-10-03 DIAGNOSIS — N183 Chronic kidney disease, stage 3 (moderate): Secondary | ICD-10-CM | POA: Diagnosis not present

## 2015-10-03 DIAGNOSIS — G609 Hereditary and idiopathic neuropathy, unspecified: Secondary | ICD-10-CM

## 2015-10-03 DIAGNOSIS — G629 Polyneuropathy, unspecified: Secondary | ICD-10-CM | POA: Diagnosis not present

## 2015-10-03 DIAGNOSIS — M79673 Pain in unspecified foot: Secondary | ICD-10-CM

## 2015-10-03 DIAGNOSIS — L89322 Pressure ulcer of left buttock, stage 2: Secondary | ICD-10-CM | POA: Diagnosis not present

## 2015-10-03 DIAGNOSIS — I5030 Unspecified diastolic (congestive) heart failure: Secondary | ICD-10-CM | POA: Diagnosis not present

## 2015-10-03 DIAGNOSIS — I130 Hypertensive heart and chronic kidney disease with heart failure and stage 1 through stage 4 chronic kidney disease, or unspecified chronic kidney disease: Secondary | ICD-10-CM | POA: Diagnosis not present

## 2015-10-03 NOTE — Progress Notes (Signed)
Patient ID: Alan Ewing, male   DOB: 1924-12-26, 81 y.o.   MRN: [id number] Complaint:  Visit Type: Patient returns to my office for continued preventative foot care services. Complaint: Patient states" my nails have grown long and thick and become painful to walk and wear shoes" Patient has been diagnosed with non diabetic neuropathy.. The patient presents for preventative foot care services. No changes to ROS  Podiatric Exam: Vascular: dorsalis pedis and posterior tibial pulses are palpable bilateral. Capillary return is immediate. Temperature gradient is diminished.. Skin turgor WNL  Sensorium: Normal Semmes Weinstein monofilament test. Normal tactile sensation bilaterally. Nail Exam: Pt has thick disfigured discolored nails with subungual debris noted bilateral entire nail hallux through fifth toenails Ulcer Exam: There is no evidence of ulcer or pre-ulcerative changes or infection. Orthopedic Exam: Muscle tone and strength are WNL. No limitations in general ROM. No crepitus or effusions noted. Foot type and digits show no abnormalities. Bony prominences are unremarkable. Skin: No Porokeratosis. No infection or ulcers  Diagnosis:  Onychomycosis, , Pain in right toe, pain in left toes  Treatment & Plan Procedures and Treatment: Consent by patient was obtained for treatment procedures. The patient understood the discussion of treatment and procedures well. All questions were answered thoroughly reviewed. Debridement of mycotic and hypertrophic toenails, 1 through 5 bilateral and clearing of subungual debris. No ulceration, no infection noted.  Return Visit-Office Procedure: Patient instructed to return to the office for a follow up visit 3 months for continued evaluation and treatment.  Gardiner Barefoot DPM

## 2015-10-06 DIAGNOSIS — I5030 Unspecified diastolic (congestive) heart failure: Secondary | ICD-10-CM | POA: Diagnosis not present

## 2015-10-06 DIAGNOSIS — N183 Chronic kidney disease, stage 3 (moderate): Secondary | ICD-10-CM | POA: Diagnosis not present

## 2015-10-06 DIAGNOSIS — F329 Major depressive disorder, single episode, unspecified: Secondary | ICD-10-CM | POA: Diagnosis not present

## 2015-10-06 DIAGNOSIS — I130 Hypertensive heart and chronic kidney disease with heart failure and stage 1 through stage 4 chronic kidney disease, or unspecified chronic kidney disease: Secondary | ICD-10-CM | POA: Diagnosis not present

## 2015-10-06 DIAGNOSIS — G629 Polyneuropathy, unspecified: Secondary | ICD-10-CM | POA: Diagnosis not present

## 2015-10-06 DIAGNOSIS — L89322 Pressure ulcer of left buttock, stage 2: Secondary | ICD-10-CM | POA: Diagnosis not present

## 2015-10-10 DIAGNOSIS — L89322 Pressure ulcer of left buttock, stage 2: Secondary | ICD-10-CM | POA: Diagnosis not present

## 2015-10-10 DIAGNOSIS — N183 Chronic kidney disease, stage 3 (moderate): Secondary | ICD-10-CM | POA: Diagnosis not present

## 2015-10-10 DIAGNOSIS — F329 Major depressive disorder, single episode, unspecified: Secondary | ICD-10-CM | POA: Diagnosis not present

## 2015-10-10 DIAGNOSIS — I5030 Unspecified diastolic (congestive) heart failure: Secondary | ICD-10-CM | POA: Diagnosis not present

## 2015-10-10 DIAGNOSIS — G629 Polyneuropathy, unspecified: Secondary | ICD-10-CM | POA: Diagnosis not present

## 2015-10-10 DIAGNOSIS — I130 Hypertensive heart and chronic kidney disease with heart failure and stage 1 through stage 4 chronic kidney disease, or unspecified chronic kidney disease: Secondary | ICD-10-CM | POA: Diagnosis not present

## 2015-10-13 DIAGNOSIS — I5030 Unspecified diastolic (congestive) heart failure: Secondary | ICD-10-CM | POA: Diagnosis not present

## 2015-10-13 DIAGNOSIS — L89322 Pressure ulcer of left buttock, stage 2: Secondary | ICD-10-CM | POA: Diagnosis not present

## 2015-10-13 DIAGNOSIS — I130 Hypertensive heart and chronic kidney disease with heart failure and stage 1 through stage 4 chronic kidney disease, or unspecified chronic kidney disease: Secondary | ICD-10-CM | POA: Diagnosis not present

## 2015-10-13 DIAGNOSIS — G629 Polyneuropathy, unspecified: Secondary | ICD-10-CM | POA: Diagnosis not present

## 2015-10-13 DIAGNOSIS — N183 Chronic kidney disease, stage 3 (moderate): Secondary | ICD-10-CM | POA: Diagnosis not present

## 2015-10-13 DIAGNOSIS — F329 Major depressive disorder, single episode, unspecified: Secondary | ICD-10-CM | POA: Diagnosis not present

## 2015-10-17 DIAGNOSIS — F329 Major depressive disorder, single episode, unspecified: Secondary | ICD-10-CM | POA: Diagnosis not present

## 2015-10-17 DIAGNOSIS — L89322 Pressure ulcer of left buttock, stage 2: Secondary | ICD-10-CM | POA: Diagnosis not present

## 2015-10-17 DIAGNOSIS — I5030 Unspecified diastolic (congestive) heart failure: Secondary | ICD-10-CM | POA: Diagnosis not present

## 2015-10-17 DIAGNOSIS — G629 Polyneuropathy, unspecified: Secondary | ICD-10-CM | POA: Diagnosis not present

## 2015-10-17 DIAGNOSIS — I130 Hypertensive heart and chronic kidney disease with heart failure and stage 1 through stage 4 chronic kidney disease, or unspecified chronic kidney disease: Secondary | ICD-10-CM | POA: Diagnosis not present

## 2015-10-17 DIAGNOSIS — N183 Chronic kidney disease, stage 3 (moderate): Secondary | ICD-10-CM | POA: Diagnosis not present

## 2015-10-24 DIAGNOSIS — L89322 Pressure ulcer of left buttock, stage 2: Secondary | ICD-10-CM | POA: Diagnosis not present

## 2015-10-24 DIAGNOSIS — F329 Major depressive disorder, single episode, unspecified: Secondary | ICD-10-CM | POA: Diagnosis not present

## 2015-10-24 DIAGNOSIS — N183 Chronic kidney disease, stage 3 (moderate): Secondary | ICD-10-CM | POA: Diagnosis not present

## 2015-10-24 DIAGNOSIS — I5030 Unspecified diastolic (congestive) heart failure: Secondary | ICD-10-CM | POA: Diagnosis not present

## 2015-10-24 DIAGNOSIS — I130 Hypertensive heart and chronic kidney disease with heart failure and stage 1 through stage 4 chronic kidney disease, or unspecified chronic kidney disease: Secondary | ICD-10-CM | POA: Diagnosis not present

## 2015-10-24 DIAGNOSIS — G629 Polyneuropathy, unspecified: Secondary | ICD-10-CM | POA: Diagnosis not present

## 2015-10-31 DIAGNOSIS — L89322 Pressure ulcer of left buttock, stage 2: Secondary | ICD-10-CM | POA: Diagnosis not present

## 2015-10-31 DIAGNOSIS — F329 Major depressive disorder, single episode, unspecified: Secondary | ICD-10-CM | POA: Diagnosis not present

## 2015-10-31 DIAGNOSIS — N183 Chronic kidney disease, stage 3 (moderate): Secondary | ICD-10-CM | POA: Diagnosis not present

## 2015-10-31 DIAGNOSIS — G629 Polyneuropathy, unspecified: Secondary | ICD-10-CM | POA: Diagnosis not present

## 2015-10-31 DIAGNOSIS — I130 Hypertensive heart and chronic kidney disease with heart failure and stage 1 through stage 4 chronic kidney disease, or unspecified chronic kidney disease: Secondary | ICD-10-CM | POA: Diagnosis not present

## 2015-10-31 DIAGNOSIS — I5030 Unspecified diastolic (congestive) heart failure: Secondary | ICD-10-CM | POA: Diagnosis not present

## 2015-11-09 DIAGNOSIS — G629 Polyneuropathy, unspecified: Secondary | ICD-10-CM | POA: Diagnosis not present

## 2015-11-09 DIAGNOSIS — F329 Major depressive disorder, single episode, unspecified: Secondary | ICD-10-CM | POA: Diagnosis not present

## 2015-11-09 DIAGNOSIS — L89322 Pressure ulcer of left buttock, stage 2: Secondary | ICD-10-CM | POA: Diagnosis not present

## 2015-11-09 DIAGNOSIS — I130 Hypertensive heart and chronic kidney disease with heart failure and stage 1 through stage 4 chronic kidney disease, or unspecified chronic kidney disease: Secondary | ICD-10-CM | POA: Diagnosis not present

## 2015-11-09 DIAGNOSIS — N183 Chronic kidney disease, stage 3 (moderate): Secondary | ICD-10-CM | POA: Diagnosis not present

## 2015-11-09 DIAGNOSIS — I5030 Unspecified diastolic (congestive) heart failure: Secondary | ICD-10-CM | POA: Diagnosis not present

## 2015-11-14 DIAGNOSIS — G629 Polyneuropathy, unspecified: Secondary | ICD-10-CM | POA: Diagnosis not present

## 2015-11-14 DIAGNOSIS — I130 Hypertensive heart and chronic kidney disease with heart failure and stage 1 through stage 4 chronic kidney disease, or unspecified chronic kidney disease: Secondary | ICD-10-CM | POA: Diagnosis not present

## 2015-11-14 DIAGNOSIS — N183 Chronic kidney disease, stage 3 (moderate): Secondary | ICD-10-CM | POA: Diagnosis not present

## 2015-11-14 DIAGNOSIS — I5030 Unspecified diastolic (congestive) heart failure: Secondary | ICD-10-CM | POA: Diagnosis not present

## 2015-11-14 DIAGNOSIS — L89322 Pressure ulcer of left buttock, stage 2: Secondary | ICD-10-CM | POA: Diagnosis not present

## 2015-11-14 DIAGNOSIS — F329 Major depressive disorder, single episode, unspecified: Secondary | ICD-10-CM | POA: Diagnosis not present

## 2015-11-23 DIAGNOSIS — G629 Polyneuropathy, unspecified: Secondary | ICD-10-CM | POA: Diagnosis not present

## 2015-11-23 DIAGNOSIS — I5030 Unspecified diastolic (congestive) heart failure: Secondary | ICD-10-CM | POA: Diagnosis not present

## 2015-11-23 DIAGNOSIS — I130 Hypertensive heart and chronic kidney disease with heart failure and stage 1 through stage 4 chronic kidney disease, or unspecified chronic kidney disease: Secondary | ICD-10-CM | POA: Diagnosis not present

## 2015-11-23 DIAGNOSIS — N183 Chronic kidney disease, stage 3 (moderate): Secondary | ICD-10-CM | POA: Diagnosis not present

## 2015-11-23 DIAGNOSIS — F329 Major depressive disorder, single episode, unspecified: Secondary | ICD-10-CM | POA: Diagnosis not present

## 2015-11-23 DIAGNOSIS — L89322 Pressure ulcer of left buttock, stage 2: Secondary | ICD-10-CM | POA: Diagnosis not present

## 2015-12-06 DIAGNOSIS — R609 Edema, unspecified: Secondary | ICD-10-CM | POA: Diagnosis not present

## 2015-12-06 DIAGNOSIS — G629 Polyneuropathy, unspecified: Secondary | ICD-10-CM | POA: Diagnosis not present

## 2015-12-06 DIAGNOSIS — E782 Mixed hyperlipidemia: Secondary | ICD-10-CM | POA: Diagnosis not present

## 2015-12-06 DIAGNOSIS — I5030 Unspecified diastolic (congestive) heart failure: Secondary | ICD-10-CM | POA: Diagnosis not present

## 2015-12-06 DIAGNOSIS — K5900 Constipation, unspecified: Secondary | ICD-10-CM | POA: Diagnosis not present

## 2015-12-06 DIAGNOSIS — N183 Chronic kidney disease, stage 3 (moderate): Secondary | ICD-10-CM | POA: Diagnosis not present

## 2015-12-06 DIAGNOSIS — I130 Hypertensive heart and chronic kidney disease with heart failure and stage 1 through stage 4 chronic kidney disease, or unspecified chronic kidney disease: Secondary | ICD-10-CM | POA: Diagnosis not present

## 2016-01-09 ENCOUNTER — Ambulatory Visit: Payer: Medicare Other | Admitting: Podiatry

## 2016-02-06 ENCOUNTER — Ambulatory Visit: Payer: Medicare Other | Admitting: Podiatry

## 2016-02-20 ENCOUNTER — Ambulatory Visit (INDEPENDENT_AMBULATORY_CARE_PROVIDER_SITE_OTHER): Payer: Medicare Other | Admitting: Podiatry

## 2016-02-20 ENCOUNTER — Encounter: Payer: Self-pay | Admitting: Podiatry

## 2016-02-20 DIAGNOSIS — B351 Tinea unguium: Secondary | ICD-10-CM

## 2016-02-20 DIAGNOSIS — M79673 Pain in unspecified foot: Secondary | ICD-10-CM

## 2016-02-20 DIAGNOSIS — G609 Hereditary and idiopathic neuropathy, unspecified: Secondary | ICD-10-CM

## 2016-02-20 NOTE — Progress Notes (Signed)
Patient ID: Alan Ewing, male   DOB: 25-May-1925, 82 y.o.   MRN: [id number] Complaint:  Visit Type: Patient returns to my office for continued preventative foot care services. Complaint: Patient states" my nails have grown long and thick and become painful to walk and wear shoes" Patient has been diagnosed with non diabetic neuropathy.. The patient presents for preventative foot care services. No changes to ROS  Podiatric Exam: Vascular: dorsalis pedis and posterior tibial pulses are palpable bilateral. Capillary return is immediate. Temperature gradient is diminished.. Skin turgor WNL  Sensorium: Normal Semmes Weinstein monofilament test. Normal tactile sensation bilaterally. Nail Exam: Pt has thick disfigured discolored nails with subungual debris noted bilateral entire nail hallux through fifth toenails Ulcer Exam: There is no evidence of ulcer or pre-ulcerative changes or infection. Orthopedic Exam: Muscle tone and strength are WNL. No limitations in general ROM. No crepitus or effusions noted. Foot type and digits show no abnormalities. Bony prominences are unremarkable. Skin: No Porokeratosis. No infection or ulcers  Diagnosis:  Onychomycosis, , Pain in right toe, pain in left toes  Treatment & Plan Procedures and Treatment: Consent by patient was obtained for treatment procedures. The patient understood the discussion of treatment and procedures well. All questions were answered thoroughly reviewed. Debridement of mycotic and hypertrophic toenails, 1 through 5 bilateral and clearing of subungual debris. No ulceration, no infection noted.  Return Visit-Office Procedure: Patient instructed to return to the office for a follow up visit 3 months for continued evaluation and treatment.  Gardiner Barefoot DPM

## 2016-04-03 DIAGNOSIS — N183 Chronic kidney disease, stage 3 (moderate): Secondary | ICD-10-CM | POA: Diagnosis not present

## 2016-04-03 DIAGNOSIS — G629 Polyneuropathy, unspecified: Secondary | ICD-10-CM | POA: Diagnosis not present

## 2016-04-03 DIAGNOSIS — K5900 Constipation, unspecified: Secondary | ICD-10-CM | POA: Diagnosis not present

## 2016-04-03 DIAGNOSIS — I130 Hypertensive heart and chronic kidney disease with heart failure and stage 1 through stage 4 chronic kidney disease, or unspecified chronic kidney disease: Secondary | ICD-10-CM | POA: Diagnosis not present

## 2016-04-03 DIAGNOSIS — I5030 Unspecified diastolic (congestive) heart failure: Secondary | ICD-10-CM | POA: Diagnosis not present

## 2016-04-03 DIAGNOSIS — E782 Mixed hyperlipidemia: Secondary | ICD-10-CM | POA: Diagnosis not present

## 2016-04-03 DIAGNOSIS — R609 Edema, unspecified: Secondary | ICD-10-CM | POA: Diagnosis not present

## 2016-05-16 DIAGNOSIS — J069 Acute upper respiratory infection, unspecified: Secondary | ICD-10-CM | POA: Diagnosis not present

## 2016-05-28 ENCOUNTER — Ambulatory Visit (INDEPENDENT_AMBULATORY_CARE_PROVIDER_SITE_OTHER): Payer: Medicare Other | Admitting: Podiatry

## 2016-05-28 ENCOUNTER — Encounter: Payer: Self-pay | Admitting: Podiatry

## 2016-05-28 DIAGNOSIS — B351 Tinea unguium: Secondary | ICD-10-CM | POA: Diagnosis not present

## 2016-05-28 DIAGNOSIS — M79673 Pain in unspecified foot: Secondary | ICD-10-CM

## 2016-05-28 NOTE — Progress Notes (Signed)
Patient ID: Alan Ewing, male   DOB: 09-01-1925, 82 y.o.   MRN: [id number] Complaint:  Visit Type: Patient returns to my office for continued preventative foot care services. Complaint: Patient states" my nails have grown long and thick and become painful to walk and wear shoes" Patient has been diagnosed with non diabetic neuropathy.. The patient presents for preventative foot care services. No changes to ROS  Podiatric Exam: Vascular: dorsalis pedis and posterior tibial pulses are palpable bilateral. Capillary return is immediate. Temperature gradient is diminished.. Skin turgor WNL  Sensorium: Normal Semmes Weinstein monofilament test. Normal tactile sensation bilaterally. Nail Exam: Pt has thick disfigured discolored nails with subungual debris noted bilateral entire nail hallux through fifth toenails Ulcer Exam: There is no evidence of ulcer or pre-ulcerative changes or infection. Orthopedic Exam: Muscle tone and strength are WNL. No limitations in general ROM. No crepitus or effusions noted. Foot type and digits show no abnormalities. Bony prominences are unremarkable. Skin: No Porokeratosis. No infection or ulcers  Diagnosis:  Onychomycosis, , Pain in right toe, pain in left toes  Treatment & Plan Procedures and Treatment: Consent by patient was obtained for treatment procedures. The patient understood the discussion of treatment and procedures well. All questions were answered thoroughly reviewed. Debridement of mycotic and hypertrophic toenails, 1 through 5 bilateral and clearing of subungual debris. No ulceration, no infection noted.  Return Visit-Office Procedure: Patient instructed to return to the office for a follow up visit 3 months for continued evaluation and treatment.  Gardiner Barefoot DPM

## 2016-06-14 DIAGNOSIS — I130 Hypertensive heart and chronic kidney disease with heart failure and stage 1 through stage 4 chronic kidney disease, or unspecified chronic kidney disease: Secondary | ICD-10-CM | POA: Diagnosis not present

## 2016-06-14 DIAGNOSIS — R609 Edema, unspecified: Secondary | ICD-10-CM | POA: Diagnosis not present

## 2016-08-19 ENCOUNTER — Encounter (HOSPITAL_COMMUNITY): Payer: Self-pay | Admitting: Emergency Medicine

## 2016-08-19 ENCOUNTER — Emergency Department (HOSPITAL_COMMUNITY)
Admission: EM | Admit: 2016-08-19 | Discharge: 2016-08-19 | Disposition: A | Discharge status: Home / Self Care | Payer: Medicare Other | Attending: Emergency Medicine | Admitting: Emergency Medicine

## 2016-08-19 DIAGNOSIS — I10 Essential (primary) hypertension: Secondary | ICD-10-CM | POA: Insufficient documentation

## 2016-08-19 DIAGNOSIS — Z79899 Other long term (current) drug therapy: Secondary | ICD-10-CM | POA: Insufficient documentation

## 2016-08-19 DIAGNOSIS — R600 Localized edema: Secondary | ICD-10-CM | POA: Diagnosis not present

## 2016-08-19 DIAGNOSIS — Z7951 Long term (current) use of inhaled steroids: Secondary | ICD-10-CM | POA: Insufficient documentation

## 2016-08-19 DIAGNOSIS — Z87891 Personal history of nicotine dependence: Secondary | ICD-10-CM | POA: Insufficient documentation

## 2016-08-19 DIAGNOSIS — Z8546 Personal history of malignant neoplasm of prostate: Secondary | ICD-10-CM | POA: Insufficient documentation

## 2016-08-19 DIAGNOSIS — R2243 Localized swelling, mass and lump, lower limb, bilateral: Secondary | ICD-10-CM | POA: Diagnosis present

## 2016-08-19 DIAGNOSIS — K644 Residual hemorrhoidal skin tags: Secondary | ICD-10-CM | POA: Diagnosis not present

## 2016-08-19 MED ORDER — HYDROCORTISONE ACETATE 25 MG RE SUPP
25.0000 mg | Freq: Two times a day (BID) | RECTAL | 0 refills | Status: AC
Start: 2016-08-19 — End: ?

## 2016-08-19 MED ORDER — DOCUSATE SODIUM 100 MG PO CAPS: 100.0000 mg | ORAL_CAPSULE | Freq: Two times a day (BID) | ORAL | 0 refills | Status: AC

## 2016-08-19 NOTE — ED Triage Notes (Signed)
Patient c/o hemorrhoids "lot of damage there'.  Patient also c/o bilat lower leg swelling with worse swelling on left side.

## 2016-08-19 NOTE — ED Provider Notes (Signed)
[facility] DEPT Provider Note   CSN: [id number] Arrival date & time: 08/19/16  1658     History   Chief Complaint Chief Complaint  Patient presents with  . Leg Swelling  . Hemorrhoids    HPI Alan Ewing is a 82 y.o. male.  He presents for evaluation of palpable hemorrhoid for 1 week, which is mildly tender. He has been constipated, with "hard balls" of stool. He denies nausea, vomiting, fever, chills, cough, chest pain, weakness or dizziness. He has had swelling in his legs, for several weeks. There are no other known modifying factors.  HPI  Past Medical History:  Diagnosis Date  . Anemia of chronic disease   . Arthritis   . Cancer [facility]) 2001   prostate cancer, seeds implanted  . Chronic renal insufficiency   . Colon adenoma   . Diverticulosis   . Hyperlipemia   . Hypertension   . Osteoarthritis   . Prostate cancer ([place])   . Seasonal allergies     Patient Active Problem List   Diagnosis Date Noted  . Hereditary and idiopathic peripheral neuropathy 10/23/2014    Past Surgical History:  Procedure Laterality Date  . APPENDECTOMY    . EYE SURGERY     L eye cataract surgery  . GAS INSERTION  12/24/2011   Procedure: INSERTION OF GAS;  Surgeon: Hayden Pedro, MD;  Location: [place];  Service: Ophthalmology;  Laterality: Left;  C3F8  . GAS/FLUID EXCHANGE  12/24/2011   Procedure: GAS/FLUID EXCHANGE;  Surgeon: Hayden Pedro, MD;  Location: [place];  Service: Ophthalmology;  Laterality: Left;  . HERNIA REPAIR     x 2  . INSERTION PROSTATE RADIATION SEED    . PARS PLANA VITRECTOMY  12/24/2011   Procedure: PARS PLANA VITRECTOMY WITH 25 GAUGE;  Surgeon: Hayden Pedro, MD;  Location: [place];  Service: Ophthalmology;  Laterality: Left;       Home Medications    Prior to Admission medications   Medication Sig Start Date End Date Taking? Authorizing Provider  carbamazepine (TEGRETOL) 200 MG tablet Take 1 tablet (200 mg total) by mouth 3 (three) times daily.  10/17/14   Melvenia Beam, MD  DULoxetine (CYMBALTA) 20 MG capsule Take 20 mg by mouth 2 (two) times daily. 06/12/15   Historical Provider, MD  fluticasone (FLONASE) 50 MCG/ACT nasal spray Place 2 sprays into both nostrils daily. 02/08/15   Historical Provider, MD  HYDROcodone-acetaminophen (NORCO) 10-325 MG per tablet Take 0.5-1 tablets by mouth every 6 (six) hours as needed. As needed for pain.    Historical Provider, MD  lisinopril (PRINIVIL,ZESTRIL) 10 MG tablet Take 10 mg by mouth daily. 06/12/15   Historical Provider, MD  omeprazole (PRILOSEC) 20 MG capsule Take 20 mg by mouth daily. 05/16/15   Historical Provider, MD  oxyCODONE (OXY IR/ROXICODONE) 5 MG immediate release tablet  03/08/15   Historical Provider, MD  pravastatin (PRAVACHOL) 10 MG tablet Take 10 mg by mouth at bedtime.    Historical Provider, MD  triamterene-hydrochlorothiazide (MAXZIDE-25) 37.5-25 MG per tablet Take 2 tablets by mouth daily.    Historical Provider, MD    Family History Family History  Problem Relation Age of Onset  . Diabetes type II Father   . Hypertension Father   . Diabetes type II Brother     1  . Hypertension Brother     1  . Neuropathy Neg Hx     Social History Social History  Substance Use Topics  .  Smoking status: Former Research scientist (life sciences)  . Smokeless tobacco: Never Used  . Alcohol use 0.0 oz/week     Comment: occasional beers     Allergies   Review of patient's allergies indicates no known allergies.   Review of Systems Review of Systems  All other systems reviewed and are negative.    Physical Exam Updated Vital Signs BP 138/62 (BP Location: Right Arm)   Pulse 70   Temp 97.6 F (36.4 C) (Oral)   Resp 18   SpO2 100%   Physical Exam  Constitutional: He is oriented to person, place, and time. He appears well-developed and well-nourished.  HENT:  Head: Normocephalic and atraumatic.  Right Ear: External ear normal.  Left Ear: External ear normal.  Eyes: Conjunctivae and EOM are normal.  Pupils are equal, round, and reactive to light.  Neck: Normal range of motion and phonation normal. Neck supple.  Cardiovascular: Normal rate, regular rhythm and normal heart sounds.   Pulmonary/Chest: Effort normal and breath sounds normal. He exhibits no bony tenderness.  Abdominal: Soft. He exhibits no mass. There is no tenderness. There is no guarding.  Genitourinary:  Genitourinary Comments: Two soft hemorrhoids, right lateral anus, which appeared to be external. The hemorrhage did not appear to be thrombosed. There is a small amount of soft brown stool in the rectum. There is no visible blood on the examining finger.  Musculoskeletal: Normal range of motion.  Neurological: He is alert and oriented to person, place, and time. No cranial nerve deficit or sensory deficit. He exhibits normal muscle tone. Coordination normal.  Skin: Skin is warm, dry and intact.  Psychiatric: He has a normal mood and affect. His behavior is normal. Judgment and thought content normal.  Nursing note and vitals reviewed.    ED Treatments / Results  Labs (all labs ordered are listed, but only abnormal results are displayed) Labs Reviewed - No data to display  EKG  EKG Interpretation None       Radiology No results found.  Procedures Procedures (including critical care time)  Medications Ordered in ED Medications - No data to display   Initial Impression / Assessment and Plan / ED Course  I have reviewed the triage vital signs and the nursing notes.  Pertinent labs & imaging results that were available during my care of the patient were reviewed by me and considered in my medical decision making (see chart for details).  Clinical Course    Medications - No data to display  Patient Vitals for the past 24 hrs:  BP Temp Temp src Pulse Resp SpO2  08/19/16 1714 138/62 97.6 F (36.4 C) Oral 70 18 100 %    6:33 PM Reevaluation with update and discussion. After initial assessment and  treatment, an updated evaluation reveals No change in clinical status. Findings discussed with patient and all questions were answered. Kaelee Pfeffer L    Final Clinical Impressions(s) / ED Diagnoses   Final diagnoses:  External hemorrhoid  Bilateral lower extremity edema    Hemorrhoids, nonthrombosed, likely related to straining for bowel movements. He has chronic pain on chronic narcotics. He is not on a bowel program. Nonspecific extremity swelling, without compromise. Patient is on diuretic currently. Doubt cardiac or respiratory complication.  Nursing Notes Reviewed/ Care Coordinated Applicable Imaging Reviewed Interpretation of Laboratory Data incorporated into ED treatment  The patient appears reasonably screened and/or stabilized for discharge and I doubt any other medical condition or other [facility] requiring further screening, evaluation, or treatment in the ED  at this time prior to discharge.  Plan: Home Medications- continue; Home Treatments- soak in tub, elevate feet; return here if the recommended treatment, does not improve the symptoms; Recommended follow up- PCP 1 week for check up   New Prescriptions New Prescriptions   No medications on file     Daleen Bo, MD 08/19/16 [phone]

## 2016-08-19 NOTE — Discharge Instructions (Signed)
Soak in a warm tub of water, once or twice a day to improve the hemorrhoid discomfort.  Elevate your feet above your heart, when resting, to improve the leg swelling.

## 2016-08-27 ENCOUNTER — Encounter: Payer: Self-pay | Admitting: Podiatry

## 2016-08-27 ENCOUNTER — Ambulatory Visit (INDEPENDENT_AMBULATORY_CARE_PROVIDER_SITE_OTHER): Payer: Medicare Other | Admitting: Podiatry

## 2016-08-27 VITALS — BP 179/95 | HR 63 | Resp 14

## 2016-08-27 DIAGNOSIS — B351 Tinea unguium: Secondary | ICD-10-CM

## 2016-08-27 DIAGNOSIS — M79676 Pain in unspecified toe(s): Secondary | ICD-10-CM

## 2016-08-27 NOTE — Progress Notes (Signed)
Patient ID: Alan Ewing, male   DOB: Nov 26, 1924, 82 y.o.   MRN: [id number] Complaint:  Visit Type: Patient returns to my office for continued preventative foot care services. Complaint: Patient states" my nails have grown long and thick and become painful to walk and wear shoes" Patient has been diagnosed with non diabetic neuropathy.. The patient presents for preventative foot care services. No changes to ROS  Podiatric Exam: Vascular: dorsalis pedis and posterior tibial pulses are palpable bilateral. Capillary return is immediate. Temperature gradient is diminished.. Skin turgor WNL  Cold feet noted. Sensorium: Normal Semmes Weinstein monofilament test. Normal tactile sensation bilaterally. Nail Exam: Pt has thick disfigured discolored nails with subungual debris noted bilateral entire nail hallux through fifth toenails Ulcer Exam: There is no evidence of ulcer or pre-ulcerative changes or infection. Orthopedic Exam: Muscle tone and strength are WNL. No limitations in general ROM. No crepitus or effusions noted. Foot type and digits show no abnormalities. Bony prominences are unremarkable. Skin: No Porokeratosis. No infection or ulcers  Diagnosis:  Onychomycosis, , Pain in right toe, pain in left toes  Treatment & Plan Procedures and Treatment: Consent by patient was obtained for treatment procedures. The patient understood the discussion of treatment and procedures well. All questions were answered thoroughly reviewed. Debridement of mycotic and hypertrophic toenails, 1 through 5 bilateral and clearing of subungual debris. No ulceration, no infection noted.  Return Visit-Office Procedure: Patient instructed to return to the office for a follow up visit 3 months for continued evaluation and treatment.  Gardiner Barefoot DPM

## 2016-10-02 DIAGNOSIS — G629 Polyneuropathy, unspecified: Secondary | ICD-10-CM | POA: Diagnosis not present

## 2016-10-02 DIAGNOSIS — I130 Hypertensive heart and chronic kidney disease with heart failure and stage 1 through stage 4 chronic kidney disease, or unspecified chronic kidney disease: Secondary | ICD-10-CM | POA: Diagnosis not present

## 2016-10-02 DIAGNOSIS — K5900 Constipation, unspecified: Secondary | ICD-10-CM | POA: Diagnosis not present

## 2016-10-02 DIAGNOSIS — I5030 Unspecified diastolic (congestive) heart failure: Secondary | ICD-10-CM | POA: Diagnosis not present

## 2016-10-02 DIAGNOSIS — Z0000 Encounter for general adult medical examination without abnormal findings: Secondary | ICD-10-CM | POA: Diagnosis not present

## 2016-10-02 DIAGNOSIS — E782 Mixed hyperlipidemia: Secondary | ICD-10-CM | POA: Diagnosis not present

## 2016-10-02 DIAGNOSIS — M1990 Unspecified osteoarthritis, unspecified site: Secondary | ICD-10-CM | POA: Diagnosis not present

## 2016-10-02 DIAGNOSIS — Z23 Encounter for immunization: Secondary | ICD-10-CM | POA: Diagnosis not present

## 2016-10-02 DIAGNOSIS — N183 Chronic kidney disease, stage 3 (moderate): Secondary | ICD-10-CM | POA: Diagnosis not present

## 2016-10-02 DIAGNOSIS — J301 Allergic rhinitis due to pollen: Secondary | ICD-10-CM | POA: Diagnosis not present

## 2016-10-02 DIAGNOSIS — R609 Edema, unspecified: Secondary | ICD-10-CM | POA: Diagnosis not present

## 2016-10-02 DIAGNOSIS — D649 Anemia, unspecified: Secondary | ICD-10-CM | POA: Diagnosis not present

## 2016-12-03 ENCOUNTER — Encounter: Payer: Self-pay | Admitting: Podiatry

## 2016-12-03 ENCOUNTER — Ambulatory Visit (INDEPENDENT_AMBULATORY_CARE_PROVIDER_SITE_OTHER): Payer: Medicare Other | Admitting: Podiatry

## 2016-12-03 VITALS — Ht 70.5 in | Wt 226.0 lb

## 2016-12-03 DIAGNOSIS — B351 Tinea unguium: Secondary | ICD-10-CM | POA: Diagnosis not present

## 2016-12-03 DIAGNOSIS — M79676 Pain in unspecified toe(s): Secondary | ICD-10-CM

## 2016-12-03 NOTE — Progress Notes (Signed)
Patient ID: Alan Ewing, male   DOB: 1924/12/27, 83 y.o.   MRN: [id number] Complaint:  Visit Type: Patient returns to my office for continued preventative foot care services. Complaint: Patient states" my nails have grown long and thick and become painful to walk and wear shoes" Patient has been diagnosed with non diabetic neuropathy.. The patient presents for preventative foot care services. No changes to ROS  Podiatric Exam: Vascular: dorsalis pedis and posterior tibial pulses are palpable bilateral. Capillary return is immediate. Temperature gradient is diminished.. Skin turgor WNL  Cold feet noted. Sensorium: Normal Semmes Weinstein monofilament test. Normal tactile sensation bilaterally. Nail Exam: Pt has thick disfigured discolored nails with subungual debris noted bilateral entire nail hallux through fifth toenails Ulcer Exam: There is no evidence of ulcer or pre-ulcerative changes or infection. Orthopedic Exam: Muscle tone and strength are WNL. No limitations in general ROM. No crepitus or effusions noted. Foot type and digits show no abnormalities. Bony prominences are unremarkable. Skin: No Porokeratosis. No infection or ulcers  Diagnosis:  Onychomycosis, , Pain in right toe, pain in left toes  Treatment & Plan Procedures and Treatment: Consent by patient was obtained for treatment procedures. The patient understood the discussion of treatment and procedures well. All questions were answered thoroughly reviewed. Debridement of mycotic and hypertrophic toenails, 1 through 5 bilateral and clearing of subungual debris. No ulceration, no infection noted.  Return Visit-Office Procedure: Patient instructed to return to the office for a follow up visit 3 months for continued evaluation and treatment.  Gardiner Barefoot DPM

## 2016-12-31 DIAGNOSIS — R0789 Other chest pain: Secondary | ICD-10-CM | POA: Diagnosis not present

## 2016-12-31 DIAGNOSIS — R600 Localized edema: Secondary | ICD-10-CM | POA: Diagnosis not present

## 2016-12-31 DIAGNOSIS — R072 Precordial pain: Secondary | ICD-10-CM | POA: Diagnosis not present

## 2016-12-31 DIAGNOSIS — J069 Acute upper respiratory infection, unspecified: Secondary | ICD-10-CM | POA: Diagnosis not present

## 2016-12-31 DIAGNOSIS — E612 Magnesium deficiency: Secondary | ICD-10-CM | POA: Diagnosis not present

## 2017-01-07 DIAGNOSIS — R600 Localized edema: Secondary | ICD-10-CM | POA: Diagnosis not present

## 2017-01-07 DIAGNOSIS — R0789 Other chest pain: Secondary | ICD-10-CM | POA: Diagnosis not present

## 2017-01-07 DIAGNOSIS — R9431 Abnormal electrocardiogram [ECG] [EKG]: Secondary | ICD-10-CM | POA: Diagnosis not present

## 2017-01-07 DIAGNOSIS — J189 Pneumonia, unspecified organism: Secondary | ICD-10-CM | POA: Diagnosis not present

## 2017-01-08 DIAGNOSIS — R9431 Abnormal electrocardiogram [ECG] [EKG]: Secondary | ICD-10-CM | POA: Diagnosis not present

## 2017-01-13 DIAGNOSIS — R9431 Abnormal electrocardiogram [ECG] [EKG]: Secondary | ICD-10-CM | POA: Diagnosis not present

## 2017-01-13 DIAGNOSIS — I517 Cardiomegaly: Secondary | ICD-10-CM | POA: Diagnosis not present

## 2017-01-13 DIAGNOSIS — R0989 Other specified symptoms and signs involving the circulatory and respiratory systems: Secondary | ICD-10-CM | POA: Diagnosis not present

## 2017-01-13 DIAGNOSIS — R072 Precordial pain: Secondary | ICD-10-CM | POA: Diagnosis not present

## 2017-01-14 DIAGNOSIS — R0989 Other specified symptoms and signs involving the circulatory and respiratory systems: Secondary | ICD-10-CM | POA: Diagnosis not present

## 2017-01-14 DIAGNOSIS — M79606 Pain in leg, unspecified: Secondary | ICD-10-CM | POA: Diagnosis not present

## 2017-01-16 DIAGNOSIS — R072 Precordial pain: Secondary | ICD-10-CM | POA: Diagnosis not present

## 2017-01-23 DIAGNOSIS — I6523 Occlusion and stenosis of bilateral carotid arteries: Secondary | ICD-10-CM | POA: Diagnosis not present

## 2017-01-23 DIAGNOSIS — R072 Precordial pain: Secondary | ICD-10-CM | POA: Diagnosis not present

## 2017-01-23 DIAGNOSIS — I739 Peripheral vascular disease, unspecified: Secondary | ICD-10-CM | POA: Diagnosis not present

## 2017-01-23 DIAGNOSIS — R9431 Abnormal electrocardiogram [ECG] [EKG]: Secondary | ICD-10-CM | POA: Diagnosis not present

## 2017-01-31 DIAGNOSIS — G609 Hereditary and idiopathic neuropathy, unspecified: Secondary | ICD-10-CM | POA: Diagnosis not present

## 2017-02-18 DIAGNOSIS — M5416 Radiculopathy, lumbar region: Secondary | ICD-10-CM | POA: Diagnosis not present

## 2017-03-04 ENCOUNTER — Encounter: Payer: Self-pay | Admitting: Podiatry

## 2017-03-04 ENCOUNTER — Ambulatory Visit (INDEPENDENT_AMBULATORY_CARE_PROVIDER_SITE_OTHER): Payer: Medicare Other | Admitting: Podiatry

## 2017-03-04 ENCOUNTER — Ambulatory Visit: Payer: Medicare Other | Admitting: Podiatry

## 2017-03-04 DIAGNOSIS — B351 Tinea unguium: Secondary | ICD-10-CM

## 2017-03-04 DIAGNOSIS — M79676 Pain in unspecified toe(s): Secondary | ICD-10-CM

## 2017-03-04 NOTE — Progress Notes (Signed)
Patient ID: Alan Ewing, male   DOB: 1925/08/10, 83 y.o.   MRN: [id number] Complaint:  Visit Type: Patient returns to my office for continued preventative foot care services. Complaint: Patient states" my nails have grown long and thick and become painful to walk and wear shoes" Patient has been diagnosed with non diabetic neuropathy.. The patient presents for preventative foot care services. No changes to ROS  Podiatric Exam: Vascular: dorsalis pedis and posterior tibial pulses are palpable bilateral. Capillary return is immediate. Temperature gradient is diminished.. Skin turgor WNL  Cold feet noted. Sensorium: Normal Semmes Weinstein monofilament test. Normal tactile sensation bilaterally. Nail Exam: Pt has thick disfigured discolored nails with subungual debris noted bilateral entire nail hallux through fifth toenails Ulcer Exam: There is no evidence of ulcer or pre-ulcerative changes or infection. Orthopedic Exam: Muscle tone and strength are WNL. No limitations in general ROM. No crepitus or effusions noted. Foot type and digits show no abnormalities. Bony prominences are unremarkable. Skin: No Porokeratosis. No infection or ulcers  Diagnosis:  Onychomycosis, , Pain in right toe, pain in left toes  Treatment & Plan Procedures and Treatment: Consent by patient was obtained for treatment procedures. The patient understood the discussion of treatment and procedures well. All questions were answered thoroughly reviewed. Debridement of mycotic and hypertrophic toenails, 1 through 5 bilateral and clearing of subungual debris. No ulceration, no infection noted.  Return Visit-Office Procedure: Patient instructed to return to the office for a follow up visit 3 months for continued evaluation and treatment.  Gardiner Barefoot DPM

## 2017-03-06 DIAGNOSIS — G609 Hereditary and idiopathic neuropathy, unspecified: Secondary | ICD-10-CM | POA: Diagnosis not present

## 2017-06-03 ENCOUNTER — Ambulatory Visit: Payer: Medicare Other | Admitting: Podiatry

## 2017-06-09 DIAGNOSIS — J0140 Acute pansinusitis, unspecified: Secondary | ICD-10-CM | POA: Diagnosis not present

## 2017-06-09 DIAGNOSIS — Z0000 Encounter for general adult medical examination without abnormal findings: Secondary | ICD-10-CM | POA: Diagnosis not present

## 2017-06-09 DIAGNOSIS — I10 Essential (primary) hypertension: Secondary | ICD-10-CM | POA: Diagnosis not present

## 2017-06-09 DIAGNOSIS — R7303 Prediabetes: Secondary | ICD-10-CM | POA: Diagnosis not present

## 2017-06-09 DIAGNOSIS — E7800 Pure hypercholesterolemia, unspecified: Secondary | ICD-10-CM | POA: Diagnosis not present

## 2017-06-09 DIAGNOSIS — Z125 Encounter for screening for malignant neoplasm of prostate: Secondary | ICD-10-CM | POA: Diagnosis not present

## 2017-06-25 DIAGNOSIS — E119 Type 2 diabetes mellitus without complications: Secondary | ICD-10-CM | POA: Diagnosis not present

## 2017-06-25 DIAGNOSIS — I10 Essential (primary) hypertension: Secondary | ICD-10-CM | POA: Diagnosis not present

## 2017-06-25 DIAGNOSIS — E7800 Pure hypercholesterolemia, unspecified: Secondary | ICD-10-CM | POA: Diagnosis not present

## 2017-06-26 ENCOUNTER — Encounter: Payer: Self-pay | Admitting: Podiatry

## 2017-06-26 ENCOUNTER — Ambulatory Visit (INDEPENDENT_AMBULATORY_CARE_PROVIDER_SITE_OTHER): Payer: Medicare Other | Admitting: Podiatry

## 2017-06-26 DIAGNOSIS — M79676 Pain in unspecified toe(s): Secondary | ICD-10-CM

## 2017-06-26 DIAGNOSIS — B351 Tinea unguium: Secondary | ICD-10-CM | POA: Diagnosis not present

## 2017-06-26 DIAGNOSIS — G609 Hereditary and idiopathic neuropathy, unspecified: Secondary | ICD-10-CM

## 2017-06-26 NOTE — Progress Notes (Signed)
Patient ID: Alan Ewing, male   DOB: 1925/06/20, 83 y.o.   MRN: [id number] Complaint:  Visit Type: Patient returns to my office for continued preventative foot care services. Complaint: Patient states" my nails have grown long and thick and become painful to walk and wear shoes" Patient has been diagnosed with non diabetic neuropathy.. The patient presents for preventative foot care services. No changes to ROS  Podiatric Exam: Vascular: dorsalis pedis and posterior tibial pulses are palpable bilateral. Capillary return is immediate. Temperature gradient is diminished.. Skin turgor WNL  Cold feet noted. Sensorium: Normal Semmes Weinstein monofilament test. Normal tactile sensation bilaterally. Nail Exam: Pt has thick disfigured discolored nails with subungual debris noted bilateral entire nail hallux through fifth toenails Ulcer Exam: There is no evidence of ulcer or pre-ulcerative changes or infection. Orthopedic Exam: Muscle tone and strength are WNL. No limitations in general ROM. No crepitus or effusions noted. Foot type and digits show no abnormalities. Bony prominences are unremarkable. Skin: No Porokeratosis. No infection or ulcers  Diagnosis:  Onychomycosis, , Pain in right toe, pain in left toes  Treatment & Plan Procedures and Treatment: Consent by patient was obtained for treatment procedures. The patient understood the discussion of treatment and procedures well. All questions were answered thoroughly reviewed. Debridement of mycotic and hypertrophic toenails, 1 through 5 bilateral and clearing of subungual debris. No ulceration, no infection noted.  Return Visit-Office Procedure: Patient instructed to return to the office for a follow up visit 3 months for continued evaluation and treatment.  Gardiner Barefoot DPM

## 2017-08-14 DIAGNOSIS — Z23 Encounter for immunization: Secondary | ICD-10-CM | POA: Diagnosis not present

## 2017-09-10 DIAGNOSIS — J0141 Acute recurrent pansinusitis: Secondary | ICD-10-CM | POA: Diagnosis not present

## 2017-09-10 DIAGNOSIS — E119 Type 2 diabetes mellitus without complications: Secondary | ICD-10-CM | POA: Diagnosis not present

## 2017-09-23 ENCOUNTER — Ambulatory Visit: Payer: Medicare Other | Admitting: Podiatry

## 2017-09-26 DIAGNOSIS — Z79899 Other long term (current) drug therapy: Secondary | ICD-10-CM | POA: Diagnosis not present

## 2017-09-26 DIAGNOSIS — E7800 Pure hypercholesterolemia, unspecified: Secondary | ICD-10-CM | POA: Diagnosis not present

## 2017-09-26 DIAGNOSIS — E119 Type 2 diabetes mellitus without complications: Secondary | ICD-10-CM | POA: Diagnosis not present

## 2017-09-26 DIAGNOSIS — J0140 Acute pansinusitis, unspecified: Secondary | ICD-10-CM | POA: Diagnosis not present

## 2017-10-10 DIAGNOSIS — J329 Chronic sinusitis, unspecified: Secondary | ICD-10-CM | POA: Diagnosis not present

## 2017-10-10 DIAGNOSIS — R42 Dizziness and giddiness: Secondary | ICD-10-CM | POA: Diagnosis not present

## 2017-10-10 DIAGNOSIS — G629 Polyneuropathy, unspecified: Secondary | ICD-10-CM | POA: Diagnosis not present

## 2017-11-07 DIAGNOSIS — H6123 Impacted cerumen, bilateral: Secondary | ICD-10-CM | POA: Diagnosis not present

## 2017-11-07 DIAGNOSIS — R42 Dizziness and giddiness: Secondary | ICD-10-CM | POA: Diagnosis not present

## 2018-03-24 ENCOUNTER — Encounter: Payer: Self-pay | Admitting: Podiatry

## 2018-03-24 ENCOUNTER — Ambulatory Visit (INDEPENDENT_AMBULATORY_CARE_PROVIDER_SITE_OTHER): Payer: Medicare Other | Admitting: Podiatry

## 2018-03-24 DIAGNOSIS — G609 Hereditary and idiopathic neuropathy, unspecified: Secondary | ICD-10-CM

## 2018-03-24 DIAGNOSIS — B351 Tinea unguium: Secondary | ICD-10-CM | POA: Diagnosis not present

## 2018-03-24 DIAGNOSIS — M79676 Pain in unspecified toe(s): Secondary | ICD-10-CM

## 2018-03-24 NOTE — Progress Notes (Signed)
Patient ID: Alan Ewing, male   DOB: November 29, 1924, 84 y.o.   MRN: [id number] Complaint:  Visit Type: Patient returns to my office for continued preventative foot care services. Complaint: Patient states" my nails have grown long and thick and become painful to walk and wear shoes" Patient has been diagnosed with non diabetic neuropathy.. The patient presents for preventative foot care services. No changes to ROS.  Patient has not seen seen for 9 months.  Podiatric Exam: Vascular: dorsalis pedis and posterior tibial pulses are palpable bilateral. Capillary return is immediate. Temperature gradient is diminished.. Skin turgor WNL  Cold feet noted. Sensorium: Normal Semmes Weinstein monofilament test. Normal tactile sensation bilaterally. Nail Exam: Pt has thick disfigured discolored nails with subungual debris noted bilateral entire nail hallux through fifth toenails Ulcer Exam: There is no evidence of ulcer or pre-ulcerative changes or infection. Orthopedic Exam: Muscle tone and strength are WNL. No limitations in general ROM. No crepitus or effusions noted. Foot type and digits show no abnormalities. Bony prominences are unremarkable. Skin: No Porokeratosis. No infection or ulcers  Diagnosis:  Onychomycosis, , Pain in right toe, pain in left toes  Treatment & Plan Procedures and Treatment: Consent by patient was obtained for treatment procedures. The patient understood the discussion of treatment and procedures well. All questions were answered thoroughly reviewed. Debridement of mycotic and hypertrophic toenails, 1 through 5 bilateral and clearing of subungual debris. No ulceration, no infection noted.  Patient to be seen by Dr. March Rummage for left ankle.  Return Visit-Office Procedure: Patient instructed to return to the office for a follow up visit 3 months for continued evaluation and treatment.  Gardiner Barefoot DPM

## 2018-03-30 ENCOUNTER — Ambulatory Visit (INDEPENDENT_AMBULATORY_CARE_PROVIDER_SITE_OTHER): Payer: Medicare Other | Admitting: Podiatry

## 2018-03-30 ENCOUNTER — Encounter: Payer: Self-pay | Admitting: Podiatry

## 2018-03-30 DIAGNOSIS — M659 Synovitis and tenosynovitis, unspecified: Secondary | ICD-10-CM

## 2018-03-30 DIAGNOSIS — E0843 Diabetes mellitus due to underlying condition with diabetic autonomic (poly)neuropathy: Secondary | ICD-10-CM

## 2018-04-02 NOTE — Progress Notes (Signed)
   Subjective:  84 year old male with PMHx of DM presenting today with a chief complaint of paresthesias to the medial aspect of the left ankle that has been ongoing for the past 2-3 years. He reports associated swelling and discoloration of the area. He was diagnosed with neuropathy in the past and was treated with "pain pills" and a cream. Patient is here for further evaluation and treatment.   Past Medical History:  Diagnosis Date  . Anemia of chronic disease   . Arthritis   . Cancer [facility]) 2001   prostate cancer, seeds implanted  . Chronic renal insufficiency   . Colon adenoma   . Diverticulosis   . Hyperlipemia   . Hypertension   . Osteoarthritis   . Prostate cancer ([place])   . Seasonal allergies      Objective / Physical Exam:  General:  The patient is alert and oriented x3 in no acute distress. Dermatology:  Skin is warm, dry and supple bilateral lower extremities. Negative for open lesions or macerations. Vascular:  Palpable pedal pulses bilaterally. No edema or erythema noted. Capillary refill within normal limits. Neurological:  Epicritic and protective threshold grossly intact bilaterally.  Musculoskeletal Exam:  Pain on palpation to the anterior lateral medial aspects of the patient's left ankle. Mild edema noted. Range of motion within normal limits to all pedal and ankle joints bilateral. Muscle strength 5/5 in all groups bilateral.    Assessment: 1. DM with neuropathy LLE 2. Ankle edema/synovitis left  Plan of Care:  1. Patient was evaluated. 2. injection of 0.5 mL Celestone Soluspan injected in the patient's left ankle. 3. Compression anklets dispensed bilaterally. 4. Continue taking gabapentin and topical cream for neuropathy.  5. Return to clinic in 6 weeks.    Edrick Kins, DPM [facility]  Dr. Edrick Kins, [address]                                        [place], [place] [address]                Office [phone]  Fax [phone]

## 2018-05-11 ENCOUNTER — Encounter: Payer: Self-pay | Admitting: Podiatry

## 2018-05-11 ENCOUNTER — Ambulatory Visit (INDEPENDENT_AMBULATORY_CARE_PROVIDER_SITE_OTHER): Payer: Medicare Other | Admitting: Podiatry

## 2018-05-11 DIAGNOSIS — E0843 Diabetes mellitus due to underlying condition with diabetic autonomic (poly)neuropathy: Secondary | ICD-10-CM

## 2018-05-11 DIAGNOSIS — R600 Localized edema: Secondary | ICD-10-CM

## 2018-05-11 NOTE — Progress Notes (Signed)
   HPI: 84 year old male presents the office today for evaluation of left lower extremity edema and discoloration around the left ankle.  Patient is very concerned about the discoloration.  He says that he does not want to lose his leg.  Patient has a history of diabetes mellitus.  He states that he takes pain medication from his primary care physician Dr. Brigitte Pulse for management of the lower extremity pain.  Patient received a corticosteroid injection to the left ankle joint on last visit and he says that there was minimal improvement.  He presents today for follow-up treatment evaluation.  Past Medical History:  Diagnosis Date  . Anemia of chronic disease   . Arthritis   . Cancer [facility]) 2001   prostate cancer, seeds implanted  . Chronic renal insufficiency   . Colon adenoma   . Diverticulosis   . Hyperlipemia   . Hypertension   . Osteoarthritis   . Prostate cancer ([place])   . Seasonal allergies      Physical Exam: General: The patient is alert and oriented x3 in no acute distress.  Dermatology: Skin is warm, dry and supple bilateral lower extremities. Negative for open lesions or macerations.  Vascular: Palpable pedal pulses bilaterally.  Moderate edema noted throughout the left lower extremity with hemosiderin deposits about the ankle goiter area.  Capillary refill within normal limits.  Neurological: Epicritic and protective threshold grossly intact bilaterally.   Musculoskeletal Exam: Range of motion within normal limits to all pedal and ankle joints bilateral. Muscle strength 5/5 in all groups bilateral.    Assessment: 1.  Left lower extremity edema with hemosiderin deposits secondary to venous insufficiency   Plan of Care:  1. Patient evaluated. 2.  Recommend that the patient continue to wear compression stockings to the left lower extremity 3.  Edema and pain management is being managed by the patient's primary care physician.  Continue management. 4.  Return to clinic  PRN       Edrick Kins, DPM [facility]  Dr. Edrick Kins, DPM    [address], [place] [address]                Office [phone]  Fax [phone]

## 2018-05-21 DIAGNOSIS — G629 Polyneuropathy, unspecified: Secondary | ICD-10-CM | POA: Diagnosis not present

## 2018-05-21 DIAGNOSIS — E782 Mixed hyperlipidemia: Secondary | ICD-10-CM | POA: Diagnosis not present

## 2018-05-21 DIAGNOSIS — N183 Chronic kidney disease, stage 3 (moderate): Secondary | ICD-10-CM | POA: Diagnosis not present

## 2018-05-21 DIAGNOSIS — I130 Hypertensive heart and chronic kidney disease with heart failure and stage 1 through stage 4 chronic kidney disease, or unspecified chronic kidney disease: Secondary | ICD-10-CM | POA: Diagnosis not present

## 2018-06-23 ENCOUNTER — Ambulatory Visit: Payer: Medicare Other | Admitting: Podiatry

## 2018-06-24 ENCOUNTER — Ambulatory Visit: Payer: Medicare Other | Admitting: Podiatry

## 2018-08-18 DIAGNOSIS — Z23 Encounter for immunization: Secondary | ICD-10-CM | POA: Diagnosis not present

## 2018-11-27 DIAGNOSIS — G8929 Other chronic pain: Secondary | ICD-10-CM | POA: Diagnosis not present

## 2018-11-27 DIAGNOSIS — G629 Polyneuropathy, unspecified: Secondary | ICD-10-CM | POA: Diagnosis not present

## 2019-02-02 DIAGNOSIS — G894 Chronic pain syndrome: Secondary | ICD-10-CM | POA: Diagnosis not present

## 2019-02-10 ENCOUNTER — Encounter: Payer: Self-pay | Admitting: Podiatry

## 2019-02-10 ENCOUNTER — Ambulatory Visit (INDEPENDENT_AMBULATORY_CARE_PROVIDER_SITE_OTHER): Payer: Medicare Other | Admitting: Podiatry

## 2019-02-10 ENCOUNTER — Other Ambulatory Visit: Payer: Self-pay

## 2019-02-10 VITALS — Temp 96.3°F

## 2019-02-10 DIAGNOSIS — E0843 Diabetes mellitus due to underlying condition with diabetic autonomic (poly)neuropathy: Secondary | ICD-10-CM

## 2019-02-10 DIAGNOSIS — R600 Localized edema: Secondary | ICD-10-CM

## 2019-02-10 DIAGNOSIS — M79676 Pain in unspecified toe(s): Secondary | ICD-10-CM | POA: Diagnosis not present

## 2019-02-10 DIAGNOSIS — B351 Tinea unguium: Secondary | ICD-10-CM | POA: Diagnosis not present

## 2019-02-10 NOTE — Progress Notes (Addendum)
Patient ID: Alan Ewing, male   DOB: [birth year], 85 y.o.   MRN: [id number] Complaint:  Visit Type: Patient returns to my office for continued preventative foot care services. Complaint: Patient states" my nails have grown long and thick and become painful to walk and wear shoes" Patient has been diagnosed with non diabetic neuropathy.. The patient presents for preventative foot care services. No changes to ROS.  Patient has not seen seen for 8 months.  Podiatric Exam: Vascular: dorsalis pedis and posterior tibial pulses are weakly  palpable bilateral. Capillary return is immediate. Temperature gradient is diminished.. Skin turgor WNL  Cold feet noted. Sensorium: Normal Semmes Weinstein monofilament test. Normal tactile sensation bilaterally. Nail Exam: Pt has thick disfigured discolored nails with subungual debris noted bilateral entire nail hallux through fifth toenails Ulcer Exam: There is no evidence of ulcer or pre-ulcerative changes or infection. Orthopedic Exam: Muscle tone and strength are WNL. No limitations in general ROM. No crepitus or effusions noted. Foot type and digits show no abnormalities. Bony prominences are unremarkable. Skin: No Porokeratosis. No infection or ulcers  Diagnosis:  Onychomycosis, , Pain in right toe, pain in left toes  Treatment & Plan Procedures and Treatment: Consent by patient was obtained for treatment procedures. The patient understood the discussion of treatment and procedures well. All questions were answered thoroughly reviewed. Debridement of mycotic and hypertrophic toenails, 1 through 5 bilateral and clearing of subungual debris. No ulceration, no infection noted. ABN signed for 2020.  Return Visit-Office Procedure: Patient instructed to return to the office for a follow up visit 3 months for continued evaluation and treatment.  Gardiner Barefoot DPM

## 2019-05-12 ENCOUNTER — Ambulatory Visit: Payer: Medicare Other | Admitting: Podiatry

## 2019-05-26 DIAGNOSIS — Z7189 Other specified counseling: Secondary | ICD-10-CM | POA: Diagnosis not present

## 2019-05-26 DIAGNOSIS — G629 Polyneuropathy, unspecified: Secondary | ICD-10-CM | POA: Diagnosis not present

## 2019-05-26 DIAGNOSIS — R609 Edema, unspecified: Secondary | ICD-10-CM | POA: Diagnosis not present

## 2019-05-26 DIAGNOSIS — M1990 Unspecified osteoarthritis, unspecified site: Secondary | ICD-10-CM | POA: Diagnosis not present

## 2019-05-26 DIAGNOSIS — E782 Mixed hyperlipidemia: Secondary | ICD-10-CM | POA: Diagnosis not present

## 2019-05-26 DIAGNOSIS — N183 Chronic kidney disease, stage 3 (moderate): Secondary | ICD-10-CM | POA: Diagnosis not present

## 2019-05-26 DIAGNOSIS — I130 Hypertensive heart and chronic kidney disease with heart failure and stage 1 through stage 4 chronic kidney disease, or unspecified chronic kidney disease: Secondary | ICD-10-CM | POA: Diagnosis not present

## 2019-05-26 DIAGNOSIS — I5030 Unspecified diastolic (congestive) heart failure: Secondary | ICD-10-CM | POA: Diagnosis not present

## 2019-06-01 ENCOUNTER — Encounter: Payer: Self-pay | Admitting: Podiatry

## 2019-06-01 ENCOUNTER — Other Ambulatory Visit: Payer: Self-pay

## 2019-06-01 ENCOUNTER — Ambulatory Visit (INDEPENDENT_AMBULATORY_CARE_PROVIDER_SITE_OTHER): Payer: Medicare Other | Admitting: Podiatry

## 2019-06-01 VITALS — Temp 97.2°F

## 2019-06-01 DIAGNOSIS — B351 Tinea unguium: Secondary | ICD-10-CM | POA: Diagnosis not present

## 2019-06-01 DIAGNOSIS — M79676 Pain in unspecified toe(s): Secondary | ICD-10-CM | POA: Diagnosis not present

## 2019-06-01 DIAGNOSIS — E0843 Diabetes mellitus due to underlying condition with diabetic autonomic (poly)neuropathy: Secondary | ICD-10-CM

## 2019-06-01 DIAGNOSIS — R600 Localized edema: Secondary | ICD-10-CM

## 2019-06-01 NOTE — Progress Notes (Signed)
Patient ID: Alan Ewing, male   DOB: January 21, 1925, 85 y.o.   MRN: [id number] Complaint:  Visit Type: Patient returns to my office for continued preventative foot care services. Complaint: Patient states" my nails have grown long and thick and become painful to walk and wear shoes" Patient has been diagnosed with non diabetic neuropathy.. The patient presents for preventative foot care services. No changes to ROS.    Podiatric Exam: Vascular: dorsalis pedis and posterior tibial pulses are weakly  palpable bilateral. Capillary return is immediate. Temperature gradient is diminished.. Skin turgor WNL  Cold feet noted. Sensorium: Normal Semmes Weinstein monofilament test. Normal tactile sensation bilaterally. Nail Exam: Pt has thick disfigured discolored nails with subungual debris noted bilateral entire nail hallux through fifth toenails Ulcer Exam: There is no evidence of ulcer or pre-ulcerative changes or infection. Orthopedic Exam: Muscle tone and strength are WNL. No limitations in general ROM. No crepitus or effusions noted. Foot type and digits show no abnormalities. Bony prominences are unremarkable. Skin: No Porokeratosis. No infection or ulcers  Diagnosis:  Onychomycosis, , Pain in right toe, pain in left toes  Treatment & Plan Procedures and Treatment: Consent by patient was obtained for treatment procedures. The patient understood the discussion of treatment and procedures well. All questions were answered thoroughly reviewed. Debridement of mycotic and hypertrophic toenails, 1 through 5 bilateral and clearing of subungual debris. No ulceration, no infection noted.   Return Visit-Office Procedure: Patient instructed to return to the office for a follow up visit 3 months for continued evaluation and treatment.  Gardiner Barefoot DPM

## 2019-09-07 ENCOUNTER — Ambulatory Visit: Payer: Medicare Other | Admitting: Podiatry

## 2019-12-02 DIAGNOSIS — Z7189 Other specified counseling: Secondary | ICD-10-CM | POA: Diagnosis not present

## 2019-12-02 DIAGNOSIS — K219 Gastro-esophageal reflux disease without esophagitis: Secondary | ICD-10-CM | POA: Diagnosis not present

## 2019-12-02 DIAGNOSIS — I130 Hypertensive heart and chronic kidney disease with heart failure and stage 1 through stage 4 chronic kidney disease, or unspecified chronic kidney disease: Secondary | ICD-10-CM | POA: Diagnosis not present

## 2019-12-02 DIAGNOSIS — K5900 Constipation, unspecified: Secondary | ICD-10-CM | POA: Diagnosis not present

## 2019-12-02 DIAGNOSIS — I5030 Unspecified diastolic (congestive) heart failure: Secondary | ICD-10-CM | POA: Diagnosis not present

## 2019-12-02 DIAGNOSIS — E782 Mixed hyperlipidemia: Secondary | ICD-10-CM | POA: Diagnosis not present

## 2019-12-02 DIAGNOSIS — N1831 Chronic kidney disease, stage 3a: Secondary | ICD-10-CM | POA: Diagnosis not present

## 2019-12-02 DIAGNOSIS — M1990 Unspecified osteoarthritis, unspecified site: Secondary | ICD-10-CM | POA: Diagnosis not present

## 2019-12-02 DIAGNOSIS — R609 Edema, unspecified: Secondary | ICD-10-CM | POA: Diagnosis not present

## 2019-12-02 DIAGNOSIS — Z0000 Encounter for general adult medical examination without abnormal findings: Secondary | ICD-10-CM | POA: Diagnosis not present

## 2019-12-02 DIAGNOSIS — J301 Allergic rhinitis due to pollen: Secondary | ICD-10-CM | POA: Diagnosis not present

## 2019-12-02 DIAGNOSIS — G629 Polyneuropathy, unspecified: Secondary | ICD-10-CM | POA: Diagnosis not present

## 2020-01-10 DIAGNOSIS — G629 Polyneuropathy, unspecified: Secondary | ICD-10-CM | POA: Diagnosis not present

## 2020-01-10 DIAGNOSIS — R600 Localized edema: Secondary | ICD-10-CM | POA: Diagnosis not present

## 2020-01-10 DIAGNOSIS — Z87891 Personal history of nicotine dependence: Secondary | ICD-10-CM | POA: Diagnosis not present

## 2020-01-10 DIAGNOSIS — I10 Essential (primary) hypertension: Secondary | ICD-10-CM | POA: Diagnosis not present

## 2020-01-10 DIAGNOSIS — E7800 Pure hypercholesterolemia, unspecified: Secondary | ICD-10-CM | POA: Diagnosis not present

## 2020-01-10 DIAGNOSIS — R069 Unspecified abnormalities of breathing: Secondary | ICD-10-CM | POA: Diagnosis not present

## 2020-01-10 DIAGNOSIS — R0602 Shortness of breath: Secondary | ICD-10-CM | POA: Diagnosis not present

## 2020-01-10 DIAGNOSIS — R609 Edema, unspecified: Secondary | ICD-10-CM | POA: Diagnosis not present

## 2020-01-18 DIAGNOSIS — G8929 Other chronic pain: Secondary | ICD-10-CM | POA: Diagnosis not present

## 2020-01-18 DIAGNOSIS — M5441 Lumbago with sciatica, right side: Secondary | ICD-10-CM | POA: Diagnosis not present

## 2020-01-18 DIAGNOSIS — M5442 Lumbago with sciatica, left side: Secondary | ICD-10-CM | POA: Diagnosis not present

## 2020-01-18 DIAGNOSIS — I739 Peripheral vascular disease, unspecified: Secondary | ICD-10-CM | POA: Diagnosis not present

## 2020-01-18 DIAGNOSIS — M48061 Spinal stenosis, lumbar region without neurogenic claudication: Secondary | ICD-10-CM | POA: Diagnosis not present

## 2020-01-18 DIAGNOSIS — G894 Chronic pain syndrome: Secondary | ICD-10-CM | POA: Diagnosis not present

## 2020-02-10 DEATH — deceased
